# Patient Record
Sex: Male | Born: 1975
Health system: Southern US, Community
[De-identification: ages and names within clinical notes are randomized; demographics above are authoritative.]

## PROBLEM LIST (undated history)

## (undated) ENCOUNTER — Emergency Department (HOSPITAL_COMMUNITY): Discharge status: Against Medical Advice | Payer: Self-pay

## (undated) DIAGNOSIS — Z789 Other specified health status: Secondary | ICD-10-CM

## (undated) DIAGNOSIS — I2510 Atherosclerotic heart disease of native coronary artery without angina pectoris: Secondary | ICD-10-CM

## (undated) DIAGNOSIS — I10 Essential (primary) hypertension: Secondary | ICD-10-CM

## (undated) DIAGNOSIS — R7989 Other specified abnormal findings of blood chemistry: Secondary | ICD-10-CM

## (undated) DIAGNOSIS — F32A Depression, unspecified: Secondary | ICD-10-CM

## (undated) DIAGNOSIS — R7303 Prediabetes: Secondary | ICD-10-CM

## (undated) DIAGNOSIS — F329 Major depressive disorder, single episode, unspecified: Secondary | ICD-10-CM

## (undated) DIAGNOSIS — F1010 Alcohol abuse, uncomplicated: Secondary | ICD-10-CM

## (undated) DIAGNOSIS — K219 Gastro-esophageal reflux disease without esophagitis: Secondary | ICD-10-CM

## (undated) DIAGNOSIS — F419 Anxiety disorder, unspecified: Secondary | ICD-10-CM

## (undated) HISTORY — DX: Essential (primary) hypertension: I10

## (undated) HISTORY — DX: Depression, unspecified: F32.A

## (undated) HISTORY — DX: Other specified abnormal findings of blood chemistry: R79.89

## (undated) HISTORY — DX: Prediabetes: R73.03

## (undated) HISTORY — PX: NO PAST SURGERIES: SHX2092

## (undated) HISTORY — DX: Anxiety disorder, unspecified: F41.9

## (undated) HISTORY — DX: Alcohol abuse, uncomplicated: F10.10

## (undated) HISTORY — DX: Gastro-esophageal reflux disease without esophagitis: K21.9

## (undated) HISTORY — DX: Atherosclerotic heart disease of native coronary artery without angina pectoris: I25.10

## (undated) HISTORY — DX: Other specified health status: Z78.9

---
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
## 2004-05-13 ENCOUNTER — Emergency Department (HOSPITAL_COMMUNITY): Admission: EM | Admit: 2004-05-13 | Discharge: 2004-05-13 | Payer: Self-pay | Admitting: Emergency Medicine

## 2004-05-15 ENCOUNTER — Emergency Department (HOSPITAL_COMMUNITY): Admission: EM | Admit: 2004-05-15 | Discharge: 2004-05-15 | Payer: Self-pay

## 2008-06-18 ENCOUNTER — Observation Stay (HOSPITAL_COMMUNITY): Admission: EM | Admit: 2008-06-18 | Discharge: 2008-06-20 | Payer: Self-pay | Admitting: Emergency Medicine

## 2008-06-19 ENCOUNTER — Encounter (INDEPENDENT_AMBULATORY_CARE_PROVIDER_SITE_OTHER): Payer: Self-pay | Admitting: *Deleted

## 2008-06-19 ENCOUNTER — Ambulatory Visit: Payer: Self-pay | Admitting: *Deleted

## 2010-12-17 NOTE — H&P (Signed)
NAMEJEROMEY, KRUER NO.:  [id number]   MEDICAL RECORD NO.:  [id number]          PATIENT TYPE:  OBV   LOCATION:  3731                         FACILITY:  [facility]   PHYSICIAN:  Della Goo, M.D. DATE OF BIRTH:  May 01, 1976   DATE OF ADMISSION:  06/17/2008  DATE OF DISCHARGE:                              HISTORY & PHYSICAL   PRIMARY CARE PHYSICIAN:  Unassigned.   CHIEF COMPLAINT:  Chest pain and passing out episodes.   HISTORY OF PRESENT ILLNESS:  This is a 35 year old Spanish-speaking only  male who was brought to the emergency department emergently after  suffering an episode of passing out in the evening prior to admission.  The patient is unable to give the history and is drowsy at this time.  His wife gives a history who is at bedside with the assistance of the  translation telephone line for Spanish.  The patient's wife reports that  the patient has had several episodes this week of chest pain discomfort  and shortness of breath which have been episodic along with several  episodes this week of passing out and his last episode of passing out  this evening.  Per report of the wife the patient has had no fevers,  chills, congestion, nausea, vomiting or diarrhea.  The patient was  evaluated in the emergency department by the emergency department  physician and had studies including a D-dimer performed.  Chest x-ray,  drug screen and alcohol level all of which were normal except his  alcohol level was found to be 285.  When questioned about how much the  patient drinks the patient's wife reports that the patient drinks only  on weekends and drinks a 12-pack of beer.  His alcohol level was found  to be 285.   PAST MEDICAL HISTORY:  None.   MEDICATIONS:  None.   PAST SURGICAL HISTORY:  None.   ALLERGIES:  NO KNOWN DRUG ALLERGIES.   SOCIAL HISTORY:  The patient is a nonsmoker and a weekend drinker per  report and no history of illicit drug usage.   FAMILY HISTORY:  Unable to obtain at this time.   PHYSICAL EXAMINATION FINDINGS:  GENERAL:  This is a 35 year old drowsy  somnolent male in no discomfort or acute distress.  VITAL SIGNS:  Temperature 98.0, blood pressure 127/65, heart rate 115  initially, now it is 105, respirations 20-25, O2 sats 95-98%.  HEENT:  Examination normocephalic, atraumatic.  Pupils are equally round  reactive to light.  Extraocular movements are intact.  Funduscopic  benign.  Oropharynx is clear.  NECK:  Supple, full range of motion.  No thyromegaly, adenopathy,  jugulovenous distention.  CARDIOVASCULAR:  Regular rate and rhythm with mild tachycardia.  No  murmurs, gallops or rubs appreciated.  LUNGS:  Clear to auscultation bilaterally.  ABDOMEN:  Positive bowel sounds, soft, nontender, nondistended.  EXTREMITIES:  Without cyanosis, clubbing or edema.  NEUROLOGY:  The patient is able to move all four extremities and  otherwise there are no focal deficits on examination.   LABORATORY STUDIES PERFORMED:  A white blood cell count 8.6, hemoglobin  15.9, hematocrit 46.7, platelets 296, neutrophils 67%, lymphocytes 21%.  Sodium 141, potassium 3.7, chloride 105, carbon dioxide 23, BUN 14,  creatinine 0.78, glucose 108.  Urine drug screen negative, D-dimer less  than 0.22.  Point of care cardiac markers with a myoglobin of 21.5, CK-  MB 1.4, troponin less than 0.05.  Chest x-ray reveals mild peribronchial  thickening.  No opacities or effusions appreciated.  The findings are  suggestive of mild bronchitic changes.  CT scan of the head performed  reveals no acute intracranial abnormality.   ASSESSMENT:  A 35 year old male being admitted with:  1. Atypical chest pain.  2. Syncopal episode.  3. Viral upper respiratory infection.  4. Alcohol intoxication.   PLAN:  The patient will be admitted to telemetry area.  Cardiac enzymes  will be performed.  Neurologic checks will also be performed and the  patient will be  placed on IV fluids which include thiamine, folate and  multivitamin therapy for now and alcohol level will be repeated in the  a.m. and if signs of withdrawal are beginning the patient will be placed  on the IV Ativan protocol.  Orthostatic vital signs will also be  performed.  The patient will be monitored for further changes and a  further workup will ensue pending results of the patient's clinical  course and his studies.      Della Goo, M.D.  Electronically Signed     HJ/MEDQ  D:  06/18/2008  T:  06/18/2008  Job:  [id number]

## 2010-12-17 NOTE — Discharge Summary (Signed)
NAMELAQUAN, BEIER NO.:  [id number]   MEDICAL RECORD NO.:  [id number]          PATIENT TYPE:  OBV   LOCATION:  3731                         FACILITY:  [facility]   PHYSICIAN:  Beckey Rutter, MD  DATE OF BIRTH:  May 26, 1976   DATE OF ADMISSION:  06/17/2008  DATE OF DISCHARGE:  06/20/2008                               DISCHARGE SUMMARY   PRIMARY CARE PHYSICIAN:  Unassigned.   CHIEF COMPLAINT:  Passing out, the chief complaint is unclear.   BRIEF HISTORY OF PRESENT ILLNESS AND HOSPITAL COURSE:  I obtained a  detailed history in regard to the episodes via a RN interpreter.  As per  the patient and the wife, the patient came out of [place] restaurant and  started to feel dizzy.  There was some ischemic change and he then  started to feel shortness of breath, that is when they called 911.  The  same thing happened a few weeks earlier after he ate at the same  restaurant.  These episodes also happened similarly last year.  No  further detail could be obtained, although the Spanish interpreter was  very good but apparently the family which is his wife and the patient  himself are not quite sure what the symptom exactly is.  As per the  description, it seems like the patient had anaphylactic reaction to some  kind of food, especially with a fact that this has happened to him every  time he eats in this restaurant.  It is noteworthy that the patient  drank 6 beers or so the same day and this also seems to have some  association with the ethanol intake.  The patient was worked up for  reason of syncope.  His CT head is negative x2.  His 2-D echo is  essentially normal and his carotid duplex is negative.  The rest of the  workup is negative.  I reviewed the telemetry monitoring for those 48  hours with the patient in the hospital and the tele tracing did not show  any evidence of arrhythmia.  The patient did not want to stay in the  hospital for EEG, nevertheless, he  agreed to follow up with [facility]  for the EEG.  I suspect that there will be very low yield from EEG for  completion of this syncope workup and I would agree the patient's events  will be stable and substantially prolonged if we keep the patient more  days just to do the EEG.  Again, he is aware and he wanted to follow up  with [facility] for possible EEG.   The patient was counseled in regard to ethanol abuse by the social work  as well as by myself at the bedside via the [place] interpreter.   Allergic reaction/anaphylactic reaction.  It is suspected after the  detailed history.  I advised the patient to refrain from eating from the  same restaurant and to avoid eating the same food until he sees  immunologist and I had a lengthy discussion with the wife at the bedside  in regards to the referral to Immunology.  She agreed to the plan of  following  up with Immunology.  In the meantime time, I will prescribe  the patient 1 dose of epinephrine pen in case he developed anaphylactic  picture as he and the wife described.  The patient was also advised to  call 911 in case he had a similar symptoms before he passed out.   DISCHARGE DIAGNOSES:  1. Passing out episodes, felt anaphylactic reaction to certain food.  2. Alcohol binging could be the reason for him passing out.   DISCHARGE MEDICATIONS:  Adrenalin pen 0.5 mg subcutaneously  intramuscularly p.r.n. if the patient becomes unresponsive.   DISCHARGE PLAN:  The patient was discharged to follow up with  [facility] for possible EEG and to follow up with Immunology as  discussed with him.  The patient and family are aware and agreeable to  the discharge plan.  Communication was done through Spanish  interpretation therapist including today to explain the risks and the  uses of the Adrenalin pen.      Beckey Rutter, MD  Electronically Signed     EME/MEDQ  D:  06/20/2008  T:  06/20/2008  Job:  [id number]

## 2011-02-23 ENCOUNTER — Emergency Department (HOSPITAL_COMMUNITY)
Admission: EM | Admit: 2011-02-23 | Discharge: 2011-02-23 | Disposition: A | Discharge status: Home / Self Care | Payer: Self-pay | Attending: Emergency Medicine | Admitting: Emergency Medicine

## 2011-02-23 DIAGNOSIS — W460XXA Contact with hypodermic needle, initial encounter: Secondary | ICD-10-CM | POA: Insufficient documentation

## 2011-02-23 DIAGNOSIS — S61209A Unspecified open wound of unspecified finger without damage to nail, initial encounter: Secondary | ICD-10-CM | POA: Insufficient documentation

## 2011-05-07 LAB — CBC
HCT: 41.3
HCT: 42
HCT: 46.7
Hemoglobin: 14.1
Hemoglobin: 14.3
Hemoglobin: 15.9
MCHC: 34
MCHC: 34.1
MCV: 89.7
MCV: 90.7
Platelets: 241
Platelets: 253
Platelets: 296
RBC: 4.55
RBC: 4.62
RBC: 5.21
RDW: 12.9
RDW: 13
WBC: 7.5
WBC: 7.9
WBC: 8.6

## 2011-05-07 LAB — CARDIAC PANEL(CRET KIN+CKTOT+MB+TROPI)
CK, MB: 0.8
CK, MB: 1.3
Relative Index: 0.7
Relative Index: 1.1
Total CK: 117
Troponin I: 0.03

## 2011-05-07 LAB — RAPID URINE DRUG SCREEN, HOSP PERFORMED
Amphetamines: NOT DETECTED
Barbiturates: NOT DETECTED
Benzodiazepines: NOT DETECTED
Cocaine: NOT DETECTED
Opiates: NOT DETECTED
Tetrahydrocannabinol: NOT DETECTED

## 2011-05-07 LAB — DIFFERENTIAL
Basophils Absolute: 0
Basophils Relative: 0
Eosinophils Absolute: 0.3
Eosinophils Relative: 4
Lymphocytes Relative: 21
Lymphs Abs: 1.8
Monocytes Absolute: 0.7
Monocytes Relative: 8
Neutro Abs: 5.8
Neutrophils Relative %: 67

## 2011-05-07 LAB — BASIC METABOLIC PANEL
BUN: 14
CO2: 23
Calcium: 8.9
Chloride: 105
Creatinine, Ser: 0.78
GFR calc Af Amer: >60
GFR calc Af Amer: >60
GFR calc non Af Amer: >60
GFR calc non Af Amer: >60
Glucose, Bld: 108 — ABNORMAL HIGH
Potassium: 3.7
Potassium: 3.7
Sodium: 136
Sodium: 141

## 2011-05-07 LAB — POCT CARDIAC MARKERS
CKMB, poc: 1.4
CKMB, poc: <1 — ABNORMAL LOW
Myoglobin, poc: 21.5
Myoglobin, poc: 23.3
Troponin i, poc: <0.05
Troponin i, poc: <0.05

## 2011-05-07 LAB — CK TOTAL AND CKMB (NOT AT ARMC)
CK, MB: 1.6
Relative Index: 1.1
Total CK: 144

## 2011-05-07 LAB — COMPREHENSIVE METABOLIC PANEL
ALT: 36
AST: 33
Albumin: 3.6
Alkaline Phosphatase: 96
BUN: 11
CO2: 25
Calcium: 8.6
Chloride: 108
Creatinine, Ser: 0.65
GFR calc Af Amer: >60
GFR calc non Af Amer: >60
Glucose, Bld: 100 — ABNORMAL HIGH
Potassium: 4
Sodium: 139
Total Bilirubin: 0.5
Total Protein: 6.6

## 2011-05-07 LAB — D-DIMER, QUANTITATIVE: D-Dimer, Quant: <0.22

## 2011-05-07 LAB — ETHANOL
Alcohol, Ethyl (B): 285 — ABNORMAL HIGH
Alcohol, Ethyl (B): 56 — ABNORMAL HIGH

## 2011-05-07 LAB — TROPONIN I: Troponin I: 0.04

## 2011-07-09 ENCOUNTER — Emergency Department (HOSPITAL_COMMUNITY)
Admission: EM | Admit: 2011-07-09 | Discharge: 2011-07-09 | Disposition: A | Discharge status: Home / Self Care | Payer: Self-pay | Attending: Emergency Medicine | Admitting: Emergency Medicine

## 2011-07-09 ENCOUNTER — Emergency Department (HOSPITAL_COMMUNITY): Payer: Self-pay

## 2011-07-09 ENCOUNTER — Encounter: Payer: Self-pay | Admitting: *Deleted

## 2011-07-09 DIAGNOSIS — R197 Diarrhea, unspecified: Secondary | ICD-10-CM | POA: Insufficient documentation

## 2011-07-09 DIAGNOSIS — R5383 Other fatigue: Secondary | ICD-10-CM | POA: Insufficient documentation

## 2011-07-09 DIAGNOSIS — R5381 Other malaise: Secondary | ICD-10-CM | POA: Insufficient documentation

## 2011-07-09 DIAGNOSIS — R109 Unspecified abdominal pain: Secondary | ICD-10-CM | POA: Insufficient documentation

## 2011-07-09 DIAGNOSIS — R509 Fever, unspecified: Secondary | ICD-10-CM | POA: Insufficient documentation

## 2011-07-09 DIAGNOSIS — R630 Anorexia: Secondary | ICD-10-CM | POA: Insufficient documentation

## 2011-07-09 LAB — COMPREHENSIVE METABOLIC PANEL
ALT: 68 U/L — ABNORMAL HIGH (ref 0–53)
CO2: 27 mEq/L (ref 19–32)
Calcium: 9.3 mg/dL (ref 8.4–10.5)
GFR calc Af Amer: >90 mL/min (ref >90)
GFR calc non Af Amer: >90 mL/min (ref >90)
Glucose, Bld: 95 mg/dL (ref 70–99)
Sodium: 140 mEq/L (ref 135–145)

## 2011-07-09 LAB — CBC
HCT: 46.5 % (ref 39.0–52.0)
Hemoglobin: 16.6 g/dL (ref 13.0–17.0)
MCH: 30.9 pg (ref 26.0–34.0)
MCHC: 35.7 g/dL (ref 30.0–36.0)

## 2011-07-09 MED ORDER — SODIUM CHLORIDE 0.9 % IV SOLN: INTRAVENOUS | Status: DC

## 2011-07-09 MED ORDER — SODIUM CHLORIDE 0.9 % IV BOLUS (SEPSIS)
1000.0000 mL | Freq: Once | INTRAVENOUS | Status: AC
  Administered 2011-07-09: 1000 mL via INTRAVENOUS

## 2011-07-09 MED ORDER — ONDANSETRON 4 MG PO TBDP: 4.0000 mg | ORAL_TABLET | Freq: Three times a day (TID) | ORAL | Status: AC | PRN

## 2011-07-09 MED ORDER — ONDANSETRON 4 MG PO TBDP: 4.0000 mg | ORAL_TABLET | Freq: Three times a day (TID) | ORAL | Status: DC | PRN

## 2011-07-09 MED ORDER — IOHEXOL 300 MG/ML  SOLN
80.0000 mL | Freq: Once | INTRAMUSCULAR | Status: AC | PRN
  Administered 2011-07-09: 80 mL via INTRAVENOUS

## 2011-07-09 MED ORDER — TRAMADOL HCL 50 MG PO TABS: 50.0000 mg | ORAL_TABLET | Freq: Four times a day (QID) | ORAL | Status: AC | PRN

## 2011-07-09 MED ORDER — DIPHENOXYLATE-ATROPINE 2.5-0.025 MG PO TABS: 1.0000 | ORAL_TABLET | Freq: Four times a day (QID) | ORAL | Status: DC | PRN

## 2011-07-09 MED ORDER — SODIUM CHLORIDE 0.9 % IV BOLUS (SEPSIS)
250.0000 mL | Freq: Once | INTRAVENOUS | Status: AC
  Administered 2011-07-09: 1000 mL via INTRAVENOUS

## 2011-07-09 MED ORDER — TRAMADOL HCL 50 MG PO TABS: 50.0000 mg | ORAL_TABLET | Freq: Four times a day (QID) | ORAL | Status: DC | PRN

## 2011-07-09 MED ORDER — DIPHENOXYLATE-ATROPINE 2.5-0.025 MG PO TABS: 1.0000 | ORAL_TABLET | Freq: Four times a day (QID) | ORAL | Status: AC | PRN

## 2011-07-09 NOTE — ED Notes (Signed)
Reports 3 days of abd pain, diarrhea, fever. Denies vomiting.

## 2011-07-09 NOTE — ED Provider Notes (Signed)
History     CSN: [id number] Arrival date & time: 07/09/2011 10:24 AM   First MD Initiated Contact with Patient 07/09/11 1223      Chief Complaint  Patient presents with  . Abdominal Pain    (Consider location/radiation/quality/duration/timing/severity/associated sxs/prior treatment) Patient is a 35 y.o. male presenting with abdominal pain. The history is provided by the patient and the spouse.  Abdominal Pain The primary symptoms of the illness include abdominal pain, fever, fatigue and diarrhea. The primary symptoms of the illness do not include shortness of breath, nausea, vomiting, hematemesis, hematochezia or dysuria. The current episode started more than 2 days ago (3 days ago). The onset of the illness was sudden. The problem has not changed since onset. The pain came on suddenly. The abdominal pain has been unchanged since its onset. The abdominal pain is generalized. The abdominal pain does not radiate. The severity of the abdominal pain is 5/10. The abdominal pain is relieved by nothing.  Additional symptoms associated with the illness include anorexia. Symptoms associated with the illness do not include back pain.   Symptoms associated with anorexia fever and diarrhea, no nausea or vomiting or history of similar symptoms. No sick contacts.   History reviewed. No pertinent past medical history.  History reviewed. No pertinent past surgical history.  History reviewed. No pertinent family history.  History  Substance Use Topics  . Smoking status: Never Smoker   . Smokeless tobacco: Not on file  . Alcohol Use: Yes     socially      Review of Systems  Constitutional: Positive for fever and fatigue.  HENT: Negative for congestion, sore throat and neck pain.   Eyes: Negative for redness and visual disturbance.  Respiratory: Negative for cough and shortness of breath.   Cardiovascular: Negative for chest pain.  Gastrointestinal: Positive for abdominal pain, diarrhea and  anorexia. Negative for nausea, vomiting, hematochezia and hematemesis.  Genitourinary: Negative for dysuria.  Musculoskeletal: Negative for back pain.  Neurological: Negative for headaches.  Psychiatric/Behavioral: Negative for confusion.    Allergies  Review of patient's allergies indicates no known allergies.  Home Medications  No current outpatient prescriptions on file.  BP 142/96  Pulse 82  Temp(Src) 98.1 F (36.7 C) (Oral)  Resp 16  SpO2 98%  Physical Exam  Nursing note and vitals reviewed. Constitutional: He is oriented to person, place, and time. He appears well-developed and well-nourished. No distress.  HENT:  Head: Normocephalic and atraumatic.  Mouth/Throat: Oropharynx is clear and moist.  Eyes: Conjunctivae and EOM are normal. Pupils are equal, round, and reactive to light.  Neck: Normal range of motion. Neck supple.  Cardiovascular: Normal rate, regular rhythm and normal heart sounds.   Pulmonary/Chest: Effort normal and breath sounds normal.  Abdominal: Soft. Bowel sounds are normal. He exhibits no mass. There is no tenderness.  Musculoskeletal: Normal range of motion. He exhibits no edema and no tenderness.  Neurological: He is alert and oriented to person, place, and time. No cranial nerve deficit. He exhibits normal muscle tone. Coordination normal.  Skin: Skin is warm. No rash noted. He is not diaphoretic. No erythema.    ED Course  Procedures (including critical care time)  Labs Reviewed  CBC - Abnormal; Notable for the following:    WBC 10.6 (*)    All other components within normal limits  COMPREHENSIVE METABOLIC PANEL - Abnormal; Notable for the following:    Total Protein 8.5 (*)    AST 69 (*)    ALT  68 (*)    All other components within normal limits  LIPASE, BLOOD   Results for orders placed during the hospital encounter of 07/09/11  CBC      Component Value Range   WBC 10.6 (*) 4.0 - 10.5 (K/uL)   RBC 5.37  4.22 - 5.81 (MIL/uL)    Hemoglobin 16.6  13.0 - 17.0 (g/dL)   HCT 16.1  09.6 - 04.5 (%)   MCV 86.6  78.0 - 100.0 (fL)   MCH 30.9  26.0 - 34.0 (pg)   MCHC 35.7  30.0 - 36.0 (g/dL)   RDW 40.9  81.1 - 91.4 (%)   Platelets 262  150 - 400 (K/uL)  LIPASE, BLOOD      Component Value Range   Lipase 28  11 - 59 (U/L)  COMPREHENSIVE METABOLIC PANEL      Component Value Range   Sodium 140  135 - 145 (mEq/L)   Potassium 3.5  3.5 - 5.1 (mEq/L)   Chloride 100  96 - 112 (mEq/L)   CO2 27  19 - 32 (mEq/L)   Glucose, Bld 95  70 - 99 (mg/dL)   BUN 16  6 - 23 (mg/dL)   Creatinine, Ser 7.82  0.50 - 1.35 (mg/dL)   Calcium 9.3  8.4 - 95.6 (mg/dL)   Total Protein 8.5 (*) 6.0 - 8.3 (g/dL)   Albumin 4.1  3.5 - 5.2 (g/dL)   AST 69 (*) 0 - 37 (U/L)   ALT 68 (*) 0 - 53 (U/L)   Alkaline Phosphatase 115  39 - 117 (U/L)   Total Bilirubin 0.5  0.3 - 1.2 (mg/dL)   GFR calc non Af Amer >90  >90 (mL/min)   GFR calc Af Amer >90  >90 (mL/min)      Dg Abd Acute W/chest  07/09/2011  *RADIOLOGY REPORT*  Clinical Data: Abdominal pain.  ACUTE ABDOMEN SERIES (ABDOMEN 2 VIEW & CHEST 1 VIEW)  Comparison: None  Findings: The upright chest x-ray demonstrates mild bronchitic changes but no infiltrates or effusions.  Two views of the abdomen demonstrate an unremarkable bowel gas pattern.  No findings for obstruction or perforation.  Scattered radiodensity is noted in the bowel and may be ingested materials such as Pepto-Bismol. An appendiceal air shadow is noted.  The bony structures are unremarkable.  IMPRESSION:  1.  No acute cardiopulmonary findings. 2.  No plain film findings for an acute abdominal process.  Original Report Authenticated By: P. Loralie Champagne, M.D.     1. Abdominal  pain, other specified site       MDM   Patient with history of 3 days of generalized abdominal pain and diarrhea also reports fever but no vomiting no blood in stools. Lab workup an acute abdominal series negative except for borderline elevation of the white blood  cell count. History does not fit consistent with a viral gastroenteritis will therefore get CT of the abdomen to rule out an inflammatory bowel process. Patient currently is nontoxic afebrile and no significant distress.  Will move patient to the CDU in a holding status, mid-level will continue care there and followup with CT results. Will give a bolus of 1 L normal saline as well.    Medical screening examination/treatment/procedure(s) were conducted as a shared visit with non-physician practitioner(s) and myself.  I personally evaluated the patient during the encounter      Shelda Jakes, MD 07/09/11 [phone]

## 2011-07-09 NOTE — ED Provider Notes (Signed)
Pt is here for abdominal pain and moved to the CDU to await CT scan to r/o inflammatory bowel process and to have pain control.   Abd/Pel CT scan show no acute or inflammatory process. Pts labs and vital signs are stable as well. Will D/C pt with pain control meds and antinausea medication.  Dorthula Matas, PA 07/09/11 [id number]

## 2011-07-10 NOTE — ED Provider Notes (Signed)
Medical screening examination/treatment/procedure(s) were conducted as a shared visit with non-physician practitioner(s) and myself.  I personally evaluated the patient during the encounter  Shelda Jakes, MD 07/10/11 [phone]

## 2014-01-16 ENCOUNTER — Emergency Department (HOSPITAL_COMMUNITY)
Admission: EM | Admit: 2014-01-16 | Discharge: 2014-01-16 | Disposition: A | Discharge status: Home / Self Care | Payer: Self-pay | Attending: Emergency Medicine | Admitting: Emergency Medicine

## 2014-01-16 ENCOUNTER — Encounter (HOSPITAL_COMMUNITY): Payer: Self-pay | Admitting: Emergency Medicine

## 2014-01-16 DIAGNOSIS — R109 Unspecified abdominal pain: Secondary | ICD-10-CM | POA: Insufficient documentation

## 2014-01-16 DIAGNOSIS — R197 Diarrhea, unspecified: Secondary | ICD-10-CM | POA: Insufficient documentation

## 2014-01-16 DIAGNOSIS — M545 Low back pain, unspecified: Secondary | ICD-10-CM | POA: Insufficient documentation

## 2014-01-16 DIAGNOSIS — B353 Tinea pedis: Secondary | ICD-10-CM | POA: Insufficient documentation

## 2014-01-16 LAB — URINALYSIS, ROUTINE W REFLEX MICROSCOPIC
Bilirubin Urine: NEGATIVE
Glucose, UA: NEGATIVE mg/dL
HGB URINE DIPSTICK: NEGATIVE
Ketones, ur: NEGATIVE mg/dL
Leukocytes, UA: NEGATIVE
NITRITE: NEGATIVE
PROTEIN: NEGATIVE mg/dL
SPECIFIC GRAVITY, URINE: 1.023 (ref 1.005–1.030)
UROBILINOGEN UA: 0.2 mg/dL (ref 0.0–1.0)
pH: 6 (ref 5.0–8.0)

## 2014-01-16 LAB — COMPREHENSIVE METABOLIC PANEL
ALT: 67 U/L — AB (ref 0–53)
AST: 46 U/L — ABNORMAL HIGH (ref 0–37)
Albumin: 4.7 g/dL (ref 3.5–5.2)
Alkaline Phosphatase: 115 U/L (ref 39–117)
BUN: 11 mg/dL (ref 6–23)
CALCIUM: 9.8 mg/dL (ref 8.4–10.5)
CO2: 25 meq/L (ref 19–32)
CREATININE: 0.58 mg/dL (ref 0.50–1.35)
Chloride: 98 mEq/L (ref 96–112)
GLUCOSE: 137 mg/dL — AB (ref 70–99)
Potassium: 3.4 mEq/L — ABNORMAL LOW (ref 3.7–5.3)
Sodium: 138 mEq/L (ref 137–147)
TOTAL PROTEIN: 8.7 g/dL — AB (ref 6.0–8.3)
Total Bilirubin: 0.8 mg/dL (ref 0.3–1.2)

## 2014-01-16 LAB — CBC WITH DIFFERENTIAL/PLATELET
Basophils Absolute: 0 10*3/uL (ref 0.0–0.1)
Basophils Relative: 0 % (ref 0–1)
EOS ABS: 0 10*3/uL (ref 0.0–0.7)
EOS PCT: 0 % (ref 0–5)
HEMATOCRIT: 45.7 % (ref 39.0–52.0)
Hemoglobin: 16.4 g/dL (ref 13.0–17.0)
LYMPHS ABS: 0.5 10*3/uL — AB (ref 0.7–4.0)
LYMPHS PCT: 5 % — AB (ref 12–46)
MCH: 31.7 pg (ref 26.0–34.0)
MCHC: 35.9 g/dL (ref 30.0–36.0)
MCV: 88.4 fL (ref 78.0–100.0)
MONO ABS: 0.7 10*3/uL (ref 0.1–1.0)
Monocytes Relative: 7 % (ref 3–12)
Neutro Abs: 9 10*3/uL — ABNORMAL HIGH (ref 1.7–7.7)
Neutrophils Relative %: 88 % — ABNORMAL HIGH (ref 43–77)
Platelets: 214 10*3/uL (ref 150–400)
RBC: 5.17 MIL/uL (ref 4.22–5.81)
RDW: 12.4 % (ref 11.5–15.5)
WBC: 10.2 10*3/uL (ref 4.0–10.5)

## 2014-01-16 LAB — LIPASE, BLOOD: LIPASE: 31 U/L (ref 11–59)

## 2014-01-16 MED ORDER — ACETAMINOPHEN 325 MG PO TABS
650.0000 mg | ORAL_TABLET | Freq: Once | ORAL | Status: AC
  Administered 2014-01-16: 650 mg via ORAL
  Filled 2014-01-16: qty 2

## 2014-01-16 MED ORDER — SODIUM CHLORIDE 0.9 % IV BOLUS (SEPSIS): 1000.0000 mL | Freq: Once | INTRAVENOUS | Status: DC

## 2014-01-16 NOTE — ED Notes (Signed)
Pt states that he has generalized abdominal pain and diarrhea. Denies N/V. Bowel sounds present. NAD noted.

## 2014-01-16 NOTE — Discharge Instructions (Signed)
Drink 1 or 2 L of water each day. Take Tylenol for pain For the foot fungus, use Tinactin, or Lotrimin.     Diarrhea Diarrhea is watery poop (stool). It can make you feel weak, tired, thirsty, or give you a dry mouth (signs of dehydration). Watery poop is a sign of another problem, most often an infection. It often lasts 2 3 days. It can last longer if it is a sign of something serious. Take care of yourself as told by your doctor. HOME CARE   Drink 1 cup (8 ounces) of fluid each time you have watery poop.  Do not drink the following fluids:  Those that contain simple sugars (fructose, glucose, galactose, lactose, sucrose, maltose).  Sports drinks.  Fruit juices.  Whole milk products.  Sodas.  Drinks with caffeine (coffee, tea, soda) or alcohol.  Oral rehydration solution may be used if the doctor says it is okay. You may make your own solution. Follow this recipe:    teaspoon table salt.   teaspoon baking soda.   teaspoon salt substitute containing potassium chloride.  1 tablespoons sugar.  1 liter (34 ounces) of water.  Avoid the following foods:  High fiber foods, such as raw fruits and vegetables.  Nuts, seeds, and whole grain breads and cereals.   Those that are sweetened with sugar alcohols (xylitol, sorbitol, mannitol).  Try eating the following foods:  Starchy foods, such as rice, toast, pasta, low-sugar cereal, oatmeal, baked potatoes, crackers, and bagels.  Bananas.  Applesauce.  Eat probiotic-rich foods, such as yogurt and milk products that are fermented.  Wash your hands well after each time you have watery poop.  Only take medicine as told by your doctor.  Take a warm bath to help lessen burning or pain from having watery poop. GET HELP RIGHT AWAY IF:   You cannot drink fluids without throwing up (vomiting).  You keep throwing up.  You have blood in your poop, or your poop looks black and tarry.  You do not pee (urinate) in 6 8  hours, or there is only a small amount of very dark pee.  You have belly (abdominal) pain that gets worse or stays in the same spot (localizes).  You are weak, dizzy, confused, or lightheaded.  You have a very bad headache.  Your watery poop gets worse or does not get better.  You have a fever or lasting symptoms for more than 2 3 days.  You have a fever and your symptoms suddenly get worse. MAKE SURE YOU:   Understand these instructions.  Will watch your condition.  Will get help right away if you are not doing well or get worse. Document Released: 01/07/2008 Document Revised: 04/14/2012 Document Reviewed: 03/28/2012 [facility] Patient Information 2014 [place], [place].  Diet for Diarrhea, Adult Frequent, runny stools (diarrhea) may be caused or worsened by food or drink. Diarrhea may be relieved by changing your diet. Since diarrhea can last up to 7 days, it is easy for you to lose too much fluid from the body and become dehydrated. Fluids that are lost need to be replaced. Along with a modified diet, make sure you drink enough fluids to keep your urine clear or pale yellow. DIET INSTRUCTIONS  Ensure adequate fluid intake (hydration): have 1 cup (8 oz) of fluid for each diarrhea episode. Avoid fluids that contain simple sugars or sports drinks, fruit juices, whole milk products, and sodas. Your urine should be clear or pale yellow if you are drinking enough fluids.  Hydrate with an oral rehydration solution that you can purchase at pharmacies, retail stores, and online. You can prepare an oral rehydration solution at home by mixing the following ingredients together:    tsp table salt.   tsp baking soda.   tsp salt substitute containing potassium chloride.  1  tablespoons sugar.  1 L (34 oz) of water.  Certain foods and beverages may increase the speed at which food moves through the gastrointestinal (GI) tract. These foods and beverages should be avoided and  include:  Caffeinated and alcoholic beverages.  High-fiber foods, such as raw fruits and vegetables, nuts, seeds, and whole grain breads and cereals.  Foods and beverages sweetened with sugar alcohols, such as xylitol, sorbitol, and mannitol.  Some foods may be well tolerated and may help thicken stool including:  Starchy foods, such as rice, toast, pasta, low-sugar cereal, oatmeal, grits, baked potatoes, crackers, and bagels.   Bananas.   Applesauce.  Add probiotic-rich foods to help increase healthy bacteria in the GI tract, such as yogurt and fermented milk products. RECOMMENDED FOODS AND BEVERAGES Starches Choose foods with less than 2 g of fiber per serving.  Recommended:  White, [place], and pita breads, plain rolls, buns, bagels. Plain muffins, matzo. Soda, saltine, or graham crackers. Pretzels, melba toast, zwieback. Cooked cereals made with water: cornmeal, farina, cream cereals. Dry cereals: refined corn, wheat, rice. Potatoes prepared any way without skins, refined macaroni, spaghetti, noodles, refined rice.  Avoid:  Bread, rolls, or crackers made with whole wheat, multi-grains, rye, bran seeds, nuts, or coconut. Corn tortillas or taco shells. Cereals containing whole grains, multi-grains, bran, coconut, nuts, raisins. Cooked or dry oatmeal. Coarse wheat cereals, granola. Cereals advertised as "high-fiber." Potato skins. Whole grain pasta, wild or brown rice. Popcorn. Sweet potatoes, yams. Sweet rolls, doughnuts, waffles, pancakes, sweet breads. Vegetables  Recommended: Strained tomato and vegetable juices. Most well-cooked and canned vegetables without seeds. Fresh: Tender lettuce, cucumber without the skin, cabbage, spinach, bean sprouts.  Avoid: Fresh, cooked, or canned: Artichokes, baked beans, beet greens, broccoli, Brussels sprouts, corn, kale, legumes, peas, sweet potatoes. Cooked: Green or red cabbage, spinach. Avoid large servings of any vegetables because vegetables  shrink when cooked, and they contain more fiber per serving than fresh vegetables. Fruit  Recommended: Cooked or canned: Apricots, applesauce, cantaloupe, cherries, fruit cocktail, grapefruit, grapes, kiwi, mandarin oranges, peaches, pears, plums, watermelon. Fresh: Apples without skin, ripe banana, grapes, cantaloupe, cherries, grapefruit, peaches, oranges, plums. Keep servings limited to  cup or 1 piece.  Avoid: Fresh: Apples with skin, apricots, mangoes, pears, raspberries, strawberries. Prune juice, stewed or dried prunes. Dried fruits, raisins, dates. Large servings of all fresh fruits. Protein  Recommended: Ground or well-cooked tender beef, ham, veal, lamb, pork, or poultry. Eggs. Fish, oysters, shrimp, lobster, other seafoods. Liver, organ meats.  Avoid: Tough, fibrous meats with gristle. Peanut butter, smooth or chunky. Cheese, nuts, seeds, legumes, dried peas, beans, lentils. Dairy  Recommended: Yogurt, lactose-free milk, kefir, drinkable yogurt, buttermilk, soy milk, or plain hard cheese.  Avoid: Milk, chocolate milk, beverages made with milk, such as milkshakes. Soups  Recommended: Bouillon, broth, or soups made from allowed foods. Any strained soup.  Avoid: Soups made from vegetables that are not allowed, cream or milk-based soups. Desserts and Sweets  Recommended: Sugar-free gelatin, sugar-free frozen ice pops made without sugar alcohol.  Avoid: Plain cakes and cookies, pie made with fruit, pudding, custard, cream pie. Gelatin, fruit, ice, sherbet, frozen ice pops. Ice cream, ice milk without nuts. Plain  hard candy, honey, jelly, molasses, syrup, sugar, chocolate syrup, gumdrops, marshmallows. Fats and Oils  Recommended: Limit fats to less than 8 tsp per day.  Avoid: Seeds, nuts, olives, avocados. Margarine, butter, cream, mayonnaise, salad oils, plain salad dressings. Plain gravy, crisp bacon without rind. Beverages  Recommended: Water, decaffeinated teas, oral  rehydration solutions, sugar-free beverages not sweetened with sugar alcohols.  Avoid: Fruit juices, caffeinated beverages (coffee, tea, soda), alcohol, sports drinks, or lemon-lime soda. Condiments  Recommended: Ketchup, mustard, horseradish, vinegar, cocoa powder. Spices in moderation: allspice, basil, bay leaves, celery powder or leaves, cinnamon, cumin powder, curry powder, ginger, mace, marjoram, onion or garlic powder, oregano, paprika, parsley flakes, ground pepper, rosemary, sage, savory, tarragon, thyme, turmeric.  Avoid: Coconut, honey. Document Released: 10/11/2003 Document Revised: 04/14/2012 Document Reviewed: 12/05/2011 [facility] Patient Information 2014 [place], [place].

## 2014-01-16 NOTE — ED Provider Notes (Signed)
CSN: [id number]     Arrival date & time 01/16/14  1310 History   First MD Initiated Contact with Patient 01/16/14 1714     Chief Complaint  Patient presents with  . Abdominal Pain  . Diarrhea     (Consider location/radiation/quality/duration/timing/severity/associated sxs/prior Treatment) Patient is a 38 y.o. male presenting with abdominal pain and diarrhea. The history is provided by the patient.  Abdominal Pain Associated symptoms: diarrhea   Diarrhea Associated symptoms: abdominal pain    He has had diarrhea for 3 days, yellow in color, and watery. No known sick contacts. No fever, chills, nausea or vomiting. He has crampy abdominal pain. He also has mild, low back pain. He has a rash on his feet. No fever, chills, cough, shortness of breath, or chest pain. There are no known modifying factors.  History reviewed. No pertinent past medical history. History reviewed. No pertinent past surgical history. No family history on file. History  Substance Use Topics  . Smoking status: Never Smoker   . Smokeless tobacco: Not on file  . Alcohol Use: Yes     Comment: socially    Review of Systems  Gastrointestinal: Positive for abdominal pain and diarrhea.  All other systems reviewed and are negative.     Allergies  Review of patient's allergies indicates no known allergies.  Home Medications   Prior to Admission medications   Not on File   BP 130/83  Pulse 84  Temp(Src) 98.3 F (36.8 C) (Oral)  Resp 12  SpO2 94% Physical Exam  Nursing note and vitals reviewed. Constitutional: He is oriented to person, place, and time. He appears well-developed and well-nourished.  HENT:  Head: Normocephalic and atraumatic.  Right Ear: External ear normal.  Left Ear: External ear normal.  Eyes: Conjunctivae and EOM are normal. Pupils are equal, round, and reactive to light.  Neck: Normal range of motion and phonation normal. Neck supple.  Cardiovascular: Normal rate, regular rhythm,  normal heart sounds and intact distal pulses.   Pulmonary/Chest: Effort normal and breath sounds normal. He exhibits no bony tenderness.  Abdominal: Soft. There is no tenderness.  Musculoskeletal: Normal range of motion.  Neurological: He is alert and oriented to person, place, and time. No cranial nerve deficit or sensory deficit. He exhibits normal muscle tone. Coordination normal.  Skin: Skin is warm, dry and intact.  Psychiatric: He has a normal mood and affect. His behavior is normal. Judgment and thought content normal.    ED Course  Procedures (including critical care time) Medications  acetaminophen (TYLENOL) tablet 650 mg (not administered)    Patient Vitals for the past 24 hrs:  BP Temp Temp src Pulse Resp SpO2  01/16/14 1829 130/83 mmHg - - 84 12 94 %  01/16/14 1715 136/77 mmHg - - 91 19 99 %  01/16/14 1700 142/98 mmHg - - 106 18 98 %  01/16/14 1532 138/85 mmHg - - 87 18 99 %  01/16/14 1315 128/86 mmHg 98.3 F (36.8 C) Oral 101 18 96 %       Labs Review Labs Reviewed  CBC WITH DIFFERENTIAL - Abnormal; Notable for the following:    Neutrophils Relative % 88 (*)    Neutro Abs 9.0 (*)    Lymphocytes Relative 5 (*)    Lymphs Abs 0.5 (*)    All other components within normal limits  COMPREHENSIVE METABOLIC PANEL - Abnormal; Notable for the following:    Potassium 3.4 (*)    Glucose, Bld 137 (*)  Total Protein 8.7 (*)    AST 46 (*)    ALT 67 (*)    All other components within normal limits  URINALYSIS, ROUTINE W REFLEX MICROSCOPIC - Abnormal; Notable for the following:    Color, Urine AMBER (*)    All other components within normal limits  LIPASE, BLOOD    Imaging Review No results found.   EKG Interpretation None      MDM   Final diagnoses:  Diarrhea  Athlete's foot    Nonspecific diarrhea. Doubt SBI or metabolic instability.  Nursing Notes Reviewed/ Care Coordinated Applicable Imaging Reviewed Interpretation of Laboratory Data incorporated  into ED treatment  The patient appears reasonably screened and/or stabilized for discharge and I doubt any other medical condition or other [facility] requiring further screening, evaluation, or treatment in the ED at this time prior to discharge.  Plan: Home Medications- Immodium, Tinactin; Home Treatments- rest, fluids; return here if the recommended treatment, does not improve the symptoms; Recommended follow up- PCP prn    Flint MelterElliott L Teffany Blaszczyk, MD 01/17/14 1352

## 2014-01-20 ENCOUNTER — Ambulatory Visit: Payer: Self-pay | Attending: Internal Medicine

## 2015-01-23 ENCOUNTER — Ambulatory Visit: Payer: Self-pay | Admitting: Internal Medicine

## 2015-02-13 ENCOUNTER — Ambulatory Visit: Payer: Self-pay

## 2015-02-13 ENCOUNTER — Other Ambulatory Visit: Payer: Self-pay | Admitting: Occupational Medicine

## 2015-02-13 DIAGNOSIS — T148XXA Other injury of unspecified body region, initial encounter: Secondary | ICD-10-CM

## 2015-04-13 ENCOUNTER — Ambulatory Visit: Payer: Self-pay | Attending: Internal Medicine

## 2016-08-11 ENCOUNTER — Encounter (HOSPITAL_COMMUNITY): Payer: Self-pay

## 2016-08-11 ENCOUNTER — Emergency Department (HOSPITAL_COMMUNITY): Payer: Self-pay

## 2016-08-11 ENCOUNTER — Emergency Department (HOSPITAL_COMMUNITY)
Admission: EM | Admit: 2016-08-11 | Discharge: 2016-08-11 | Disposition: A | Discharge status: Home / Self Care | Payer: Self-pay | Attending: Emergency Medicine | Admitting: Emergency Medicine

## 2016-08-11 DIAGNOSIS — R071 Chest pain on breathing: Secondary | ICD-10-CM | POA: Insufficient documentation

## 2016-08-11 LAB — BASIC METABOLIC PANEL
Anion gap: 11 (ref 5–15)
BUN: 11 mg/dL (ref 6–20)
CALCIUM: 9.5 mg/dL (ref 8.9–10.3)
CHLORIDE: 100 mmol/L — AB (ref 101–111)
CO2: 25 mmol/L (ref 22–32)
CREATININE: 0.69 mg/dL (ref 0.61–1.24)
Glucose, Bld: 181 mg/dL — ABNORMAL HIGH (ref 65–99)
Potassium: 3.7 mmol/L (ref 3.5–5.1)
SODIUM: 136 mmol/L (ref 135–145)

## 2016-08-11 LAB — I-STAT TROPONIN, ED: TROPONIN I, POC: 0.01 ng/mL (ref 0.00–0.08)

## 2016-08-11 LAB — TROPONIN I: Troponin I: <0.03 ng/mL (ref <0.03)

## 2016-08-11 MED ORDER — MELOXICAM 7.5 MG PO TABS
7.5000 mg | ORAL_TABLET | Freq: Once | ORAL | Status: AC
  Administered 2016-08-11: 7.5 mg via ORAL
  Filled 2016-08-11: qty 1

## 2016-08-11 MED ORDER — OXYCODONE-ACETAMINOPHEN 5-325 MG PO TABS
ORAL_TABLET | ORAL | Status: AC
  Filled 2016-08-11: qty 1

## 2016-08-11 MED ORDER — OXYCODONE-ACETAMINOPHEN 5-325 MG PO TABS
1.0000 | ORAL_TABLET | ORAL | Status: DC | PRN
  Administered 2016-08-11: 1 via ORAL

## 2016-08-11 MED ORDER — MELOXICAM 7.5 MG PO TABS: 7.5000 mg | ORAL_TABLET | Freq: Every day | ORAL | 0 refills | Status: AC
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
## 2016-08-11 NOTE — ED Provider Notes (Signed)
[facility]-EMERGENCY DEPT Provider Note   CSN: [id number] Arrival date & time: 08/11/16  4098     History   Chief Complaint Chief Complaint  Patient presents with  . Chest Pain    HPI Stephen Oneal is a 41 y.o. male.   Chest Pain   This is a new problem. The current episode started more than 1 week ago. The problem occurs daily. The problem has not changed since onset.The pain is associated with coughing, raising an arm, breathing and lifting. The pain is present in the substernal region and lateral region. The pain is moderate. The quality of the pain is described as sharp and stabbing. The pain does not radiate. The symptoms are aggravated by deep breathing and certain positions.    History reviewed. No pertinent past medical history.  There are no active problems to display for this patient.   History reviewed. No pertinent surgical history.     Home Medications    Prior to Admission medications   Medication Sig Start Date End Date Taking? Authorizing Provider  ibuprofen (ADVIL,MOTRIN) 200 MG tablet Take 200 mg by mouth every 6 (six) hours as needed (for pain).   Yes Historical Provider, MD  meloxicam (MOBIC) 7.5 MG tablet Take 1 tablet (7.5 mg total) by mouth daily. 08/11/16 08/25/16  Marily Memos, MD    Family History No family history on file.  Social History Social History  Substance Use Topics  . Smoking status: Never Smoker  . Smokeless tobacco: Never Used  . Alcohol use Yes     Comment: socially     Allergies   Patient has no known allergies.   Review of Systems Review of Systems  Cardiovascular: Positive for chest pain.  All other systems reviewed and are negative.    Physical Exam Updated Vital Signs BP 148/85 (BP Location: Right Arm)   Pulse 68   Temp 97 F (36.1 C) (Oral)   Resp 18   Wt 140 lb (63.5 kg)   SpO2 98%   Physical Exam  Constitutional: He appears well-developed and well-nourished.  HENT:  Head: Normocephalic and  atraumatic.  Eyes: Conjunctivae and EOM are normal.  Neck: Normal range of motion.  Cardiovascular: Normal rate and normal heart sounds.  Exam reveals no gallop and no friction rub.   No murmur heard. Pulmonary/Chest: Effort normal. No respiratory distress. He has no wheezes. He exhibits tenderness (left lateral ribs).  Abdominal: Soft. He exhibits no distension.  Musculoskeletal: Normal range of motion.  Neurological: He is alert.  Nursing note and vitals reviewed.    ED Treatments / Results  Labs (all labs ordered are listed, but only abnormal results are displayed) Labs Reviewed  BASIC METABOLIC PANEL - Abnormal; Notable for the following:       Result Value   Chloride 100 (*)    Glucose, Bld 181 (*)    All other components within normal limits  TROPONIN I  I-STAT TROPOININ, ED    EKG  EKG Interpretation  Date/Time:  Monday August 11 2016 10:00:06 EST Ventricular Rate:  99 PR Interval:  172 QRS Duration: 78 QT Interval:  342 QTC Calculation: 438 R Axis:   93 Text Interpretation:  Normal sinus rhythm Rightward axis Borderline ECG increased amplitude of all waves compared to 2009 Confirmed by [facility] MD, Kedrick Mcnamee [phone]) on 08/11/2016 4:33:52 PM       Radiology Dg Chest 2 View  Result Date: 08/11/2016 CLINICAL DATA:  Left sided chest pain and SOB X 8 days.  EXAM: CHEST - 2 VIEW COMPARISON:  06/17/2008 FINDINGS: Lungs are clear. Heart size and mediastinal contours are within normal limits. No effusion.  No pneumothorax. Visualized bones unremarkable. IMPRESSION: No acute cardiopulmonary disease. Electronically Signed   By: Corlis Leak  Hassell M.D.   On: 08/11/2016 10:53    Procedures Procedures (including critical care time)  Medications Ordered in ED Medications  meloxicam (MOBIC) tablet 7.5 mg (7.5 mg Oral Given 08/11/16 1949)     Initial Impression / Assessment and Plan / ED Course  I have reviewed the triage vital signs and the nursing notes.  Pertinent labs & imaging  results that were available during my care of the patient were reviewed by me and considered in my medical decision making (see chart for details).  Clinical Course     PERC negativde, doubt PE.  Atypical for ACS, negative troponins and no ischemic changes on ECG.  No e/o PTX/PNA on CXR.  Possibly rib/muscular pain. Plan for NSAIDs and pcp follow up.   Final Clinical Impressions(s) / ED Diagnoses   Final diagnoses:  Chest pain on breathing    New Prescriptions Discharge Medication List as of 08/11/2016  7:14 PM    START taking these medications   Details  meloxicam (MOBIC) 7.5 MG tablet Take 1 tablet (7.5 mg total) by mouth daily., Starting Mon 08/11/2016, Until Mon 08/25/2016, Print         Marily MemosJason Bubba Vanbenschoten, MD 08/12/16 [phone]

## 2016-08-11 NOTE — ED Notes (Signed)
Delay explained 

## 2016-08-11 NOTE — ED Notes (Signed)
E-signature not available. Pt verbalized understanding of DC instructions, prescription and work note.

## 2016-11-16 ENCOUNTER — Encounter (HOSPITAL_COMMUNITY): Payer: Self-pay | Admitting: Emergency Medicine

## 2016-11-16 ENCOUNTER — Emergency Department (HOSPITAL_COMMUNITY): Payer: Self-pay

## 2016-11-16 ENCOUNTER — Emergency Department (HOSPITAL_COMMUNITY)
Admission: EM | Admit: 2016-11-16 | Discharge: 2016-11-17 | Disposition: A | Discharge status: Home / Self Care | Payer: Self-pay | Attending: Emergency Medicine | Admitting: Emergency Medicine

## 2016-11-16 DIAGNOSIS — Y92810 Car as the place of occurrence of the external cause: Secondary | ICD-10-CM | POA: Insufficient documentation

## 2016-11-16 DIAGNOSIS — R0989 Other specified symptoms and signs involving the circulatory and respiratory systems: Secondary | ICD-10-CM | POA: Insufficient documentation

## 2016-11-16 DIAGNOSIS — Y999 Unspecified external cause status: Secondary | ICD-10-CM | POA: Insufficient documentation

## 2016-11-16 DIAGNOSIS — X371XXA Tornado, initial encounter: Secondary | ICD-10-CM | POA: Insufficient documentation

## 2016-11-16 DIAGNOSIS — Y9389 Activity, other specified: Secondary | ICD-10-CM | POA: Insufficient documentation

## 2016-11-16 DIAGNOSIS — S8011XA Contusion of right lower leg, initial encounter: Secondary | ICD-10-CM | POA: Insufficient documentation
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
## 2016-11-16 NOTE — ED Notes (Signed)
Patient transported to X-ray 
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
## 2016-11-16 NOTE — ED Notes (Signed)
Assessed pt w/ the use of the interpertor
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
## 2016-11-17 NOTE — Procedures (Signed)
Preop diagnosis: Pharyngeal foreign body Postop diagnosis: normal pharynx and larynx Procedure: Transnasal fiberoptic laryngoscopy Surgeon: Jenne Pane Anesth: Topical with 4% lidocaine Compl: None Findings: No foreign body or bleeding was seen in the pharynx or larynx. Description:  After discussing risks, benefits, and alternatives, the patient was placed in a seated position and the right nasal passage was sprayed with topical anesthetic.  The fiberoptic scope was passed through the right nasal passage to view the pharynx and larynx.  Findings are noted above.  The scope was then removed and he was returned to nursing care in stable condition.
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
## 2018-02-01 ENCOUNTER — Emergency Department (HOSPITAL_COMMUNITY): Payer: Self-pay

## 2018-02-01 ENCOUNTER — Other Ambulatory Visit: Payer: Self-pay

## 2018-02-01 ENCOUNTER — Emergency Department (HOSPITAL_COMMUNITY)
Admission: EM | Admit: 2018-02-01 | Discharge: 2018-02-01 | Disposition: A | Discharge status: Home / Self Care | Payer: Self-pay | Attending: Emergency Medicine | Admitting: Emergency Medicine

## 2018-02-01 ENCOUNTER — Encounter (HOSPITAL_COMMUNITY): Payer: Self-pay | Admitting: Emergency Medicine

## 2018-02-01 DIAGNOSIS — K219 Gastro-esophageal reflux disease without esophagitis: Secondary | ICD-10-CM | POA: Insufficient documentation

## 2018-02-01 DIAGNOSIS — F419 Anxiety disorder, unspecified: Secondary | ICD-10-CM | POA: Insufficient documentation

## 2018-02-01 LAB — CBC
HEMATOCRIT: 44.1 % (ref 39.0–52.0)
Hemoglobin: 15.1 g/dL (ref 13.0–17.0)
MCH: 31.3 pg (ref 26.0–34.0)
MCHC: 34.2 g/dL (ref 30.0–36.0)
MCV: 91.3 fL (ref 78.0–100.0)
Platelets: 223 10*3/uL (ref 150–400)
RBC: 4.83 MIL/uL (ref 4.22–5.81)
RDW: 12.1 % (ref 11.5–15.5)
WBC: 8.8 10*3/uL (ref 4.0–10.5)

## 2018-02-01 LAB — URINALYSIS, ROUTINE W REFLEX MICROSCOPIC
Bacteria, UA: NONE SEEN
Bilirubin Urine: NEGATIVE
GLUCOSE, UA: NEGATIVE mg/dL
Ketones, ur: NEGATIVE mg/dL
Leukocytes, UA: NEGATIVE
Nitrite: NEGATIVE
PROTEIN: NEGATIVE mg/dL
Specific Gravity, Urine: 1.017 (ref 1.005–1.030)
pH: 5 (ref 5.0–8.0)

## 2018-02-01 LAB — HEPATIC FUNCTION PANEL
ALBUMIN: 4.5 g/dL (ref 3.5–5.0)
ALT: 38 U/L (ref 0–44)
AST: 45 U/L — ABNORMAL HIGH (ref 15–41)
Alkaline Phosphatase: 75 U/L (ref 38–126)
BILIRUBIN TOTAL: 0.6 mg/dL (ref 0.3–1.2)
Bilirubin, Direct: <0.1 mg/dL (ref 0.0–0.2)
Total Protein: 7.9 g/dL (ref 6.5–8.1)

## 2018-02-01 LAB — I-STAT TROPONIN, ED: TROPONIN I, POC: 0.01 ng/mL (ref 0.00–0.08)

## 2018-02-01 LAB — BASIC METABOLIC PANEL
ANION GAP: 10 (ref 5–15)
BUN: 13 mg/dL (ref 6–20)
CALCIUM: 9.3 mg/dL (ref 8.9–10.3)
CHLORIDE: 103 mmol/L (ref 98–111)
CO2: 28 mmol/L (ref 22–32)
Creatinine, Ser: 0.71 mg/dL (ref 0.61–1.24)
GFR calc non Af Amer: >60 mL/min (ref >60)
Glucose, Bld: 149 mg/dL — ABNORMAL HIGH (ref 70–99)
POTASSIUM: 3.6 mmol/L (ref 3.5–5.1)
Sodium: 141 mmol/L (ref 135–145)

## 2018-02-01 LAB — CBG MONITORING, ED: Glucose-Capillary: 111 mg/dL — ABNORMAL HIGH (ref 70–99)

## 2018-02-01 LAB — LIPASE, BLOOD: LIPASE: 40 U/L (ref 11–51)

## 2018-02-01 MED ORDER — RANITIDINE HCL 150 MG PO CAPS: 150.0000 mg | ORAL_CAPSULE | Freq: Every day | ORAL | 0 refills | Status: DC

## 2018-02-01 MED ORDER — GI COCKTAIL ~~LOC~~
30.0000 mL | Freq: Once | ORAL | Status: AC
  Administered 2018-02-01: 30 mL via ORAL
  Filled 2018-02-01: qty 30

## 2018-02-01 MED ORDER — IOPAMIDOL (ISOVUE-370) INJECTION 76%
100.0000 mL | Freq: Once | INTRAVENOUS | Status: AC | PRN
  Administered 2018-02-01: 100 mL via INTRAVENOUS

## 2018-02-01 MED ORDER — IOPAMIDOL (ISOVUE-370) INJECTION 76%
INTRAVENOUS | Status: AC
  Filled 2018-02-01: qty 100

## 2018-02-01 NOTE — ED Notes (Signed)
Patient transported to X-ray 

## 2018-02-01 NOTE — ED Notes (Signed)
Patient transported to CT 

## 2018-02-01 NOTE — ED Provider Notes (Signed)
[facility] Provider Note   CSN: [id number] Arrival date & time: 02/01/18  1003     History   Chief Complaint Chief Complaint  Patient presents with  . Shortness of Breath  . Dizziness    HPI Stephen Oneal is a 42 y.o. male presenting for evaluation of shortness of breath and vomiting.  Patient states over the past week, he has been having intermittent shortness of breath.  He had one episode of vomiting this morning.  He states he will feel like he is suddenly having difficulty breathing, and then go outside and walk around to catch some air.  This resolves his symptoms.  Symptoms often occur after eating, worse with spicy food.  He states he has been told that he has gastritis in the past, took some medicine first short period of time, but is not currently on anything.  He denies associated chest pain.  He states he has had a poor appetite for the past several weeks, he feels hungry but does not want to eat.  Patient denies fevers, chills, abdominal pain, urinary symptoms, abnormal bowel movements. Pt states right now all he feels "nerves." no SOB, nausea, or other sxs.  HPI  History reviewed. No pertinent past medical history.  There are no active problems to display for this patient.   History reviewed. No pertinent surgical history.      Home Medications    Prior to Admission medications   Medication Sig Start Date End Date Taking? Authorizing Provider  ibuprofen (ADVIL,MOTRIN) 200 MG tablet Take 200 mg by mouth every 6 (six) hours as needed (for pain).    [provider]  ranitidine (ZANTAC) 150 MG capsule Take 1 capsule (150 mg total) by mouth daily. 02/01/18   Kenora Spayd, PA-C    Family History No family history on file.  Social History Social History   Tobacco Use  . Smoking status: Never Smoker  . Smokeless tobacco: Never Used  Substance Use Topics  . Alcohol use: Yes    Comment: socially  .  Drug use: No     Allergies   Patient has no known allergies.   Review of Systems Review of Systems  Respiratory: Positive for shortness of breath (intermittent).   Psychiatric/Behavioral: The patient is nervous/anxious.   All other systems reviewed and are negative.    Physical Exam Updated Vital Signs BP (!) 145/93   Pulse 68   Temp 98.3 F (36.8 C)   Resp 11   SpO2 94%   Physical Exam  Constitutional: He is oriented to person, place, and time. He appears well-developed and well-nourished. No distress.  Resting comfortably.  Appears in no distress.  HENT:  Head: Normocephalic and atraumatic.  Mouth/Throat: Uvula is midline, oropharynx is clear and moist and mucous membranes are normal. Mucous membranes are not dry.  Eyes: Pupils are equal, round, and reactive to light. Conjunctivae and EOM are normal.  EOMI and PERRLA.  No nystagmus.  Neck: Normal range of motion. Neck supple.  Cardiovascular: Normal rate, regular rhythm and intact distal pulses.  Pulmonary/Chest: Effort normal and breath sounds normal. No respiratory distress. He has no wheezes.  speaking in full sentences.  Clear lung sounds in all fields.  No signs of respiratory distress or shortness of breath.  Abdominal: Soft. Bowel sounds are normal. He exhibits no distension and no mass. There is tenderness. There is no rebound and no guarding.  Mild tenderness palpation of epigastric abdomen.  Otherwise soft  without rigidity, guarding, distention, or tenderness.  Musculoskeletal: Normal range of motion.  Strength intact x4.  Sensation intact x4.  Radial pedal pulses intact bilaterally.  Neurological: He is alert and oriented to person, place, and time.  Skin: Skin is warm and dry.  Psychiatric: He has a normal mood and affect.  Nursing note and vitals reviewed.    ED Treatments / Results  Labs (all labs ordered are listed, but only abnormal results are displayed) Labs Reviewed  BASIC METABOLIC PANEL -  Abnormal; Notable for the following components:      Result Value   Glucose, Bld 149 (*)    All other components within normal limits  URINALYSIS, ROUTINE W REFLEX MICROSCOPIC - Abnormal; Notable for the following components:   Hgb urine dipstick SMALL (*)    All other components within normal limits  HEPATIC FUNCTION PANEL - Abnormal; Notable for the following components:   AST 45 (*)    All other components within normal limits  CBG MONITORING, ED - Abnormal; Notable for the following components:   Glucose-Capillary 111 (*)    All other components within normal limits  CBC  LIPASE, BLOOD  I-STAT TROPONIN, ED    EKG None  Radiology Dg Chest 2 View  Result Date: 02/01/2018 CLINICAL DATA:  Chest pain. EXAM: CHEST - 2 VIEW COMPARISON:  Radiographs of November 16, 2016. FINDINGS: The heart size and mediastinal contours are within normal limits. Both lungs are clear. No pneumothorax or pleural effusion is noted. The visualized skeletal structures are unremarkable. IMPRESSION: No active cardiopulmonary disease. Electronically Signed   By: Lupita Raider, M.D.   On: 02/01/2018 12:30   Ct Angio Chest Pe W/cm &/or Wo Cm  Result Date: 02/01/2018 CLINICAL DATA:  Shortness of breath. EXAM: CT ANGIOGRAPHY CHEST WITH CONTRAST TECHNIQUE: Multidetector CT imaging of the chest was performed using the standard protocol during bolus administration of intravenous contrast. Multiplanar CT image reconstructions and MIPs were obtained to evaluate the vascular anatomy. CONTRAST:  70 mL ISOVUE-370 IOPAMIDOL (ISOVUE-370) INJECTION 76% COMPARISON:  Radiographs of same day. FINDINGS: Cardiovascular: Satisfactory opacification of the pulmonary arteries to the segmental level. No evidence of pulmonary embolism. Normal heart size. No pericardial effusion. Mediastinum/Nodes: No enlarged mediastinal, hilar, or axillary lymph nodes. Thyroid gland, trachea, and esophagus demonstrate no significant findings. Lungs/Pleura:  Lungs are clear. No pleural effusion or pneumothorax. Upper Abdomen: No acute abnormality. Musculoskeletal: No chest wall abnormality. No acute or significant osseous findings. Review of the MIP images confirms the above findings. IMPRESSION: No definite evidence of pulmonary embolus. No acute abnormality seen in the chest. Electronically Signed   By: Lupita Raider, M.D.   On: 02/01/2018 14:35    Procedures Procedures (including critical care time)  Medications Ordered in ED Medications  gi cocktail (Maalox,Lidocaine,Donnatal) (30 mLs Oral Given 02/01/18 1448)  iopamidol (ISOVUE-370) 76 % injection 100 mL (100 mLs Intravenous Contrast Given 02/01/18 1408)     Initial Impression / Assessment and Plan / ED Course  I have reviewed the triage vital signs and the nursing notes.  Pertinent labs & imaging results that were available during my care of the patient were reviewed by me and considered in my medical decision making (see chart for details).     Patient presenting for evaluation of shortness of breath and vomiting.  Physical exam reassuring, patient is afebrile not tachycardic.  He appears nontoxic.  History consistent with a likely abdominal etiology, as shortness of breath improves when patient walks  around, symptoms worsen after eating, and patient reports a GERD-like picture with worsening pain and symptoms after eating spicy foods.  History of gastritis, but currently not on any medications.  Labs reassuring, initial troponin negative.  UA without infection.  No leukocytosis.  Liver function and pancreatic function reassuring.  Chest x-ray reviewed and interpreted by me, no pneumonia, pneumothorax, or effusions.  EKG without STEMI.  As patient reports this intermittent chest pain, and has a slightly tachycardic heart rate on arrival, will obtain CT to rule out PE.  CTA negative for PE.  Discussed with patient.  GI cocktail given, patient reports improvement of symptoms.  Discussed that at  this time, doubt PE, ACS, or infection.  Likely abdominal etiology and anxiety.  Patient states that his son has been doing drugs and needs to be admitted to a group home, they have been fighting frequently.  He states he has been feeling very anxious.  I discussed with patient use of Zantac to help with acid control.  Maalox as needed.  Importance of follow-up with primary care.  At this time, patient appears safe for discharge.  Return precautions given.  Patient states he understands and agrees plan.   Final Clinical Impressions(s) / ED Diagnoses   Final diagnoses:  Gastroesophageal reflux disease, esophagitis presence not specified  Anxiety    ED Discharge Orders        Ordered    ranitidine (ZANTAC) 150 MG capsule  Daily     02/01/18 1621       Mikayla Chiusano, PA-C 02/01/18 2305    Bethann Berkshire, MD 02/04/18 1009

## 2018-02-01 NOTE — ED Triage Notes (Signed)
Son/translator stated, he has been some SOB and some dizziness since one week, is only off and on.

## 2018-02-01 NOTE — Discharge Instructions (Signed)
Take zanta as prescribed.  Use maalox if you have worsening pain.  It is important that you follow up with [facility] for further evaluation.  Return to the ER if you develop persistent difficulty breathing, chest pain, or any new or concerning symptoms.

## 2018-02-05 ENCOUNTER — Emergency Department (HOSPITAL_COMMUNITY)
Admission: EM | Admit: 2018-02-05 | Discharge: 2018-02-05 | Disposition: A | Discharge status: Home / Self Care | Payer: Self-pay | Attending: Emergency Medicine | Admitting: Emergency Medicine

## 2018-02-05 ENCOUNTER — Other Ambulatory Visit: Payer: Self-pay

## 2018-02-05 ENCOUNTER — Emergency Department (HOSPITAL_COMMUNITY): Payer: Self-pay

## 2018-02-05 ENCOUNTER — Encounter (HOSPITAL_COMMUNITY): Payer: Self-pay

## 2018-02-05 DIAGNOSIS — R0789 Other chest pain: Secondary | ICD-10-CM | POA: Insufficient documentation

## 2018-02-05 LAB — I-STAT TROPONIN, ED: Troponin i, poc: 0 ng/mL (ref 0.00–0.08)

## 2018-02-05 LAB — BASIC METABOLIC PANEL
Anion gap: 9 (ref 5–15)
BUN: 12 mg/dL (ref 6–20)
CHLORIDE: 106 mmol/L (ref 98–111)
CO2: 26 mmol/L (ref 22–32)
Calcium: 9.3 mg/dL (ref 8.9–10.3)
Creatinine, Ser: 0.75 mg/dL (ref 0.61–1.24)
GFR calc Af Amer: >60 mL/min (ref >60)
GFR calc non Af Amer: >60 mL/min (ref >60)
GLUCOSE: 142 mg/dL — AB (ref 70–99)
POTASSIUM: 3.7 mmol/L (ref 3.5–5.1)
SODIUM: 141 mmol/L (ref 135–145)

## 2018-02-05 LAB — CBC
HCT: 44.8 % (ref 39.0–52.0)
HEMOGLOBIN: 15 g/dL (ref 13.0–17.0)
MCH: 31 pg (ref 26.0–34.0)
MCHC: 33.5 g/dL (ref 30.0–36.0)
MCV: 92.6 fL (ref 78.0–100.0)
Platelets: 235 10*3/uL (ref 150–400)
RBC: 4.84 MIL/uL (ref 4.22–5.81)
RDW: 11.9 % (ref 11.5–15.5)
WBC: 7.2 10*3/uL (ref 4.0–10.5)

## 2018-02-05 MED ORDER — ALPRAZOLAM 0.25 MG PO TABS: 0.2500 mg | ORAL_TABLET | Freq: Three times a day (TID) | ORAL | 0 refills | Status: DC | PRN

## 2018-02-05 NOTE — ED Provider Notes (Signed)
Patient placed in Quick Look pathway, seen and evaluated   Chief Complaint: chest pain  HPI:   Stephen Oneal is a 42 y.o. male who presents to the ED with chest pain. Patient describes the pain as pressure and states that he feels desperate.he reports feeling anxious and short of breath. Patient recently evaluated in the ED and treated for GERD. He was told to return if symptoms worsened so he did.  ROS: GI: nausea   Pulmonary: shortness of breath and chest pain  Physical Exam:  BP (!) 141/86 (BP Location: Right Arm)   Pulse 60   Temp 98.1 F (36.7 C) (Oral)   Resp 16   Ht 5\' 5"  (1.651 Oneal)   Wt 59 kg (130 lb)   SpO2 100%   BMI 21.63 kg/Oneal     Gen: No distress  Neuro: Awake and Alert  Skin: Warm and dry      Initiation of care has begun. The patient has been counseled on the process, plan, and necessity for staying for the completion/evaluation, and the remainder of the medical screening examination    Janne Napoleoneese, Stephen Mollenkopf M, NP 02/05/18 1449    Jacalyn LefevreHaviland, Julie, MD 02/05/18 1515

## 2018-02-05 NOTE — ED Provider Notes (Signed)
[facility] Provider Note   CSN: [id number] Arrival date & time: 02/05/18  1254     History   Chief Complaint Chief Complaint  Patient presents with  . Chest Pain    HPI Antonieta Perticanor Santos-Martinez is a 42 y.o. male.  42 year old male who presents with episodic episodes of chest pressure and tightness that are nonexertional and lasts from minutes to days.  Seen here recently for similar symptoms and had an evaluation including a chest CT as well as cardiac enzymes which were negative.  Patient has no significant cardiac risk factors and has been under a great deal of stress recently due to family issues.  He denies any persistent cough or fever.  No CHF type symptoms.  Nothing makes his symptoms better or worse.  No treatment used for prior to arrival.     History reviewed. No pertinent past medical history.  There are no active problems to display for this patient.   History reviewed. No pertinent surgical history.      Home Medications    Prior to Admission medications   Medication Sig Start Date End Date Taking? Authorizing Provider  ibuprofen (ADVIL,MOTRIN) 200 MG tablet Take 200 mg by mouth every 6 (six) hours as needed (for pain).    [provider]  ranitidine (ZANTAC) 150 MG capsule Take 1 capsule (150 mg total) by mouth daily. 02/01/18   Caccavale, Sophia, PA-C    Family History No family history on file.  Social History Social History   Tobacco Use  . Smoking status: Never Smoker  . Smokeless tobacco: Never Used  Substance Use Topics  . Alcohol use: Yes    Alcohol/week: 8.4 oz    Types: 14 Cans of beer per week    Comment: daily  . Drug use: No     Allergies   Patient has no known allergies.   Review of Systems Review of Systems  All other systems reviewed and are negative.    Physical Exam Updated Vital Signs BP 134/89   Pulse 68   Temp 98.1 F (36.7 C) (Oral)   Resp 14   Ht 1.651 m (5'  5")   Wt 59 kg (130 lb)   SpO2 100%   BMI 21.63 kg/m   Physical Exam  Constitutional: He is oriented to person, place, and time. He appears well-developed and well-nourished.  Non-toxic appearance. No distress.  HENT:  Head: Normocephalic and atraumatic.  Eyes: Pupils are equal, round, and reactive to light. Conjunctivae, EOM and lids are normal.  Neck: Normal range of motion. Neck supple. No tracheal deviation present. No thyroid mass present.  Cardiovascular: Normal rate, regular rhythm and normal heart sounds. Exam reveals no gallop.  No murmur heard. Pulmonary/Chest: Effort normal and breath sounds normal. No stridor. No respiratory distress. He has no decreased breath sounds. He has no wheezes. He has no rhonchi. He has no rales.  Abdominal: Soft. Normal appearance and bowel sounds are normal. He exhibits no distension. There is no tenderness. There is no rebound and no CVA tenderness.  Musculoskeletal: Normal range of motion. He exhibits no edema or tenderness.  Neurological: He is alert and oriented to person, place, and time. He has normal strength. No cranial nerve deficit or sensory deficit. GCS eye subscore is 4. GCS verbal subscore is 5. GCS motor subscore is 6.  Skin: Skin is warm and dry. No abrasion and no rash noted.  Psychiatric: He has a normal mood and affect. His  speech is normal and behavior is normal.  Nursing note and vitals reviewed.    ED Treatments / Results  Labs (all labs ordered are listed, but only abnormal results are displayed) Labs Reviewed  BASIC METABOLIC PANEL - Abnormal; Notable for the following components:      Result Value   Glucose, Bld 142 (*)    All other components within normal limits  CBC  I-STAT TROPONIN, ED    EKG EKG Interpretation  Date/Time:  Friday February 05 2018 13:25:19 EDT Ventricular Rate:  65 PR Interval:  174 QRS Duration: 76 QT Interval:  394 QTC Calculation: 409 R Axis:   19 Text Interpretation:  Normal sinus  rhythm Normal ECG Confirmed by Lorre Nick ([id number]) on 02/05/2018 3:19:58 PM   Radiology Dg Chest 2 View  Result Date: 02/05/2018 CLINICAL DATA:  Chest pain EXAM: CHEST - 2 VIEW COMPARISON:  Chest radiograph and chest CT February 01, 2018 FINDINGS: No edema or consolidation. There is an apparent nipple shadow on each side. Heart size and pulmonary vascularity are normal. No adenopathy. No bone lesions. IMPRESSION: No edema or consolidation. The parent nipple shadows bilaterally. Stable cardiac silhouette. Electronically Signed   By: Bretta Bang III M.D.   On: 02/05/2018 13:59    Procedures Procedures (including critical care time)  Medications Ordered in ED Medications - No data to display   Initial Impression / Assessment and Plan / ED Course  I have reviewed the triage vital signs and the nursing notes.  Pertinent labs & imaging results that were available during my care of the patient were reviewed by me and considered in my medical decision making (see chart for details).    Patient's work-up reviewed here and EKG is unchanged.  Chest x-ray without acute findings.  Cardiac enzymes negative.  Low suspicion for ACS.  Heart score is about 1.  Suspect component of anxiety and panic with this.  Will prescribe PRN Xanax and will give referrals.   Final Clinical Impressions(s) / ED Diagnoses   Final diagnoses:  None    ED Discharge Orders    None       Lorre Nick, MD 02/05/18 1544

## 2018-02-05 NOTE — ED Triage Notes (Signed)
Pt c/o anxiety, CP, and shaking with no improvement since being seen here on Monday.

## 2018-02-18 ENCOUNTER — Encounter: Payer: Self-pay | Admitting: General Practice

## 2018-02-18 ENCOUNTER — Ambulatory Visit: Payer: Self-pay | Attending: Family Medicine | Admitting: Physician Assistant

## 2018-02-18 VITALS — BP 131/85 | HR 82 | Temp 98.2°F | Resp 18 | Ht 63.0 in | Wt 126.0 lb

## 2018-02-18 DIAGNOSIS — R739 Hyperglycemia, unspecified: Secondary | ICD-10-CM

## 2018-02-18 DIAGNOSIS — R030 Elevated blood-pressure reading, without diagnosis of hypertension: Secondary | ICD-10-CM

## 2018-02-18 DIAGNOSIS — Z789 Other specified health status: Secondary | ICD-10-CM

## 2018-02-18 DIAGNOSIS — F419 Anxiety disorder, unspecified: Secondary | ICD-10-CM

## 2018-02-18 DIAGNOSIS — Z09 Encounter for follow-up examination after completed treatment for conditions other than malignant neoplasm: Secondary | ICD-10-CM

## 2018-02-18 DIAGNOSIS — Z79899 Other long term (current) drug therapy: Secondary | ICD-10-CM | POA: Insufficient documentation

## 2018-02-18 LAB — GLUCOSE, POCT (MANUAL RESULT ENTRY): POC Glucose: 106 mg/dl — AB (ref 70–99)

## 2018-02-18 MED ORDER — BUSPIRONE HCL 10 MG PO TABS: 10.0000 mg | ORAL_TABLET | Freq: Two times a day (BID) | ORAL | 3 refills | Status: DC

## 2018-02-18 MED FILL — busPIRone HCL 10 MG TABS: 10 | 30 days supply | Qty: 60 | Fill #0

## 2018-02-18 NOTE — Patient Instructions (Signed)
Check blood pressure out of the office 3 to 5 times and record and bring to next visit.

## 2018-02-18 NOTE — Progress Notes (Signed)
Patient ID: Stephen Oneal, male   DOB: March 13, 1976, 42 y.o.   MRN: [id number]     Stephen Oneal, is a 42 y.o. male  [id number]  [id number]  DOB - March 13, 1976  Subjective:  Chief Complaint and HPI: Stephen Oneal is a 42 y.o. male here today to establish care and for a follow up visit Seen in ED 02/05/2018 for CP.  He has had some anxiety since these ED visits.  Xanax alleviates s/sx.  When he gets anxious, he feels shaky, pressure across his entire chest, and SOB which tends to lessen if his wife talks to him/prays over him/rubs alcohol on his chest. Symptoms also lessen if he gets up and walks around and exerts himself.  No radiating pain.  No substernal pain.  Wife is with him and gives much of history.  Rafael with [employer] interpre[employer] translating.  BP has been up and normal out of the office.  Hyperglycemia was noted on chart review.  No FH early/sudden cardiac events.    From ED notes: 42 year old male who presents with episodic episodes of chest pressure and tightness that are nonexertional and lasts from minutes to days.  Seen here recently for similar symptoms and had an evaluation including a chest CT as well as cardiac enzymes which were negative.  Patient has no significant cardiac risk factors and has been under a great deal of stress recently due to family issues.  He denies any persistent cough or fever.  No CHF type symptoms.  Nothing makes his symptoms better or worse.  No treatment used for prior to arrival.  EKG: EKG Interpretation  Date/Time:                  Friday February 05 2018 13:25:19 EDT Ventricular Rate:         65 PR Interval:                 174 QRS Duration: 76 QT Interval:                 394 QTC Calculation:        409 R Axis:                         19 Text Interpretation:       Normal sinus rhythm Normal ECG Confirmed by Lorre NickAllen, Anthony   From A/P: Pertinent labs & imaging results that were available during my care of the  patient were reviewed by me and considered in my medical decision making (see chart for details).  Patient's work-up reviewed here and EKG is unchanged.  Chest x-ray without acute findings.  Cardiac enzymes negative.  Low suspicion for ACS.  Heart score is about 1.  Suspect component of anxiety and panic with this.  Will prescribe PRN Xanax and will give referrals.  Rafael with [employer] interpre[employer] translating.   ED/Hospital notes reviewed.   Social History:  Married, drives a fork lift at work Family history:  No FH early MI.  + DM, and htn in family  ROS:   Constitutional:  No f/c, No night sweats, No unexplained weight loss. EENT:  No vision changes, No blurry vision, No hearing changes. No mouth, throat, or ear problems.  Respiratory: No cough, No SOB Cardiac: + CP as described above, no palpitations GI:  No abd pain, No N/V/D. GU: No Urinary s/sx Musculoskeletal: No joint pain Neuro: No headache, no dizziness, no motor weakness.  Skin: No rash Endocrine:  No polydipsia. No polyuria.  Psych: Denies SI/HI, + anxiety  No problems updated.  ALLERGIES: No Known Allergies  PAST MEDICAL HISTORY: History reviewed. No pertinent past medical history.  MEDICATIONS AT HOME: Prior to Admission medications   Medication Sig Start Date End Date Taking? Authorizing Provider  ALPRAZolam (XANAX) 0.25 MG tablet Take 1 tablet (0.25 mg total) by mouth 3 (three) times daily as needed for anxiety. 02/05/18  Yes Lorre Nick, MD  ibuprofen (ADVIL,MOTRIN) 200 MG tablet Take 200 mg by mouth every 6 (six) hours as needed (for pain).   Yes [provider]  ranitidine (ZANTAC) 150 MG capsule Take 1 capsule (150 mg total) by mouth daily. 02/01/18  Yes Caccavale, Sophia, PA-C  busPIRone (BUSPAR) 10 MG tablet Take 1 tablet (10 mg total) by mouth 2 (two) times daily. 02/18/18   Anders Simmonds, PA-C     Objective:  EXAM:   Vitals:   02/18/18 0856  BP: 131/85  Pulse: 82  Resp: 18  Temp:  98.2 F (36.8 C)  TempSrc: Oral  SpO2: 97%  Weight: 126 lb (57.2 kg)  Height: 5\' 3"  (1.6 m)    General appearance : A&OX3. NAD. Non-toxic-appearing HEENT: Atraumatic and Normocephalic.  PERRLA. EOM intact.   Neck: supple, no JVD. No cervical lymphadenopathy. No thyromegaly Chest/Lungs:  Breathing-non-labored, Good air entry bilaterally, breath sounds normal without rales, rhonchi, or wheezing  CVS: S1 S2 regular, no murmurs, gallops, rubs  Extremities: Bilateral Lower Ext shows no edema, both legs are warm to touch with = pulse throughout Neurology:  CN II-XII grossly intact, Non focal.   Psych:  TP linear. J/I WNL. Normal speech. Appropriate eye contact and affect.  Skin:  No Rash  Data Review No results found for: HGBA1C   Assessment & Plan   1. Hyperglycemia Eliminate/limit sugar/white carbohydrates/alcohol from diet - Glucose (CBG) - Hemoglobin A1c  2. Elevated blood pressure reading Check blood pressure 3-5 times weekly and record and bring to next visit-may need meds  3. Encounter for examination following treatment at hospital No further s/sx.    4. Anxiety Trial of buspar 10mg  bid.  Stop xanax as this is not a good long term solution but it did help his symptoms - Vitamin D, 25-hydroxy - TSH  5. [employer] interpre[employer] used and additional time performing visit was required.   Patient have been counseled extensively about nutrition and exercise  Return in about 1 month (around 03/21/2018) for assign PCP; follow up BP, sugar, and anxiety,.  The patient was given clear instructions to go to ER or return to medical center if symptoms don't improve, worsen or new problems develop. The patient verbalized understanding. The patient was told to call to get lab results if they haven't heard anything in the next week.     Georgian Co, PA-C [facility] and [facility] [place], [place] [phone]   02/18/2018, 9:09 AM

## 2018-02-19 LAB — HEMOGLOBIN A1C
Est. average glucose Bld gHb Est-mCnc: 117 mg/dL
HEMOGLOBIN A1C: 5.7 % — AB (ref 4.8–5.6)

## 2018-02-19 LAB — VITAMIN D 25 HYDROXY (VIT D DEFICIENCY, FRACTURES): Vit D, 25-Hydroxy: 32.4 ng/mL (ref 30.0–100.0)

## 2018-02-19 LAB — TSH: TSH: 4.63 u[IU]/mL — ABNORMAL HIGH (ref 0.450–4.500)

## 2018-03-01 ENCOUNTER — Ambulatory Visit: Payer: Self-pay | Attending: Nurse Practitioner

## 2018-03-29 ENCOUNTER — Ambulatory Visit: Payer: Self-pay | Attending: Nurse Practitioner | Admitting: Nurse Practitioner

## 2018-03-29 ENCOUNTER — Encounter: Payer: Self-pay | Admitting: Nurse Practitioner

## 2018-03-29 VITALS — BP 157/98 | HR 72 | Temp 99.0°F | Ht 63.0 in | Wt 133.6 lb

## 2018-03-29 DIAGNOSIS — K219 Gastro-esophageal reflux disease without esophagitis: Secondary | ICD-10-CM

## 2018-03-29 DIAGNOSIS — Z79899 Other long term (current) drug therapy: Secondary | ICD-10-CM | POA: Insufficient documentation

## 2018-03-29 DIAGNOSIS — R7303 Prediabetes: Secondary | ICD-10-CM

## 2018-03-29 DIAGNOSIS — F419 Anxiety disorder, unspecified: Secondary | ICD-10-CM

## 2018-03-29 DIAGNOSIS — I10 Essential (primary) hypertension: Secondary | ICD-10-CM

## 2018-03-29 LAB — GLUCOSE, POCT (MANUAL RESULT ENTRY): POC GLUCOSE: 100 mg/dL — AB (ref 70–99)

## 2018-03-29 MED ORDER — CLONIDINE HCL 0.1 MG PO TABS
0.2000 mg | ORAL_TABLET | Freq: Once | ORAL | Status: AC
  Administered 2018-03-29: 0.2 mg via ORAL

## 2018-03-29 MED ORDER — RANITIDINE HCL 150 MG PO CAPS: 150.0000 mg | ORAL_CAPSULE | Freq: Every day | ORAL | 0 refills | Status: DC

## 2018-03-29 MED ORDER — RANITIDINE HCL 150 MG PO CAPS: 150.0000 mg | ORAL_CAPSULE | Freq: Every day | ORAL | 1 refills | Status: DC

## 2018-03-29 MED ORDER — BUSPIRONE HCL 10 MG PO TABS: 10.0000 mg | ORAL_TABLET | Freq: Two times a day (BID) | ORAL | 3 refills | Status: DC

## 2018-03-29 MED FILL — raNITIdine HCL 150 MG TABS: 150 | 30 days supply | Qty: 30 | Fill #0

## 2018-03-29 MED FILL — busPIRone HCL 10 MG TABS: 10 | 30 days supply | Qty: 60 | Fill #0

## 2018-03-29 NOTE — Patient Instructions (Signed)
Plan de alimentacin DASH DASH Eating Plan DASH es la sigla en ingls de "Enfoques Alimentarios para Detener la Hipertensin" (Dietary Approaches to Stop Hypertension). El plan de alimentacin DASH ha demostrado bajar la presin arterial elevada (hipertensin). Tambin puede reducir el riesgo de diabetes tipo 2, enfermedad cardaca y accidente cerebrovascular. Este plan tambin puede ayudar a adelgazar. Consejos para seguir este plan [place] generales  Evite ingerir ms de 2,300 mg (miligramos) de sal (sodio) por da. Si tiene hipertensin, es posible que necesite reducir la ingesta de sodio a 1,500 mg por da.  Limite el consumo de alcohol a no ms de 1medida por da si es mujer y no est embarazada, y 2medidas por da si es hombre. Una medida equivale a 12oz (355ml) de cerveza, 5oz (148ml) de vino o 1oz (44ml) de bebidas alcohlicas de alta graduacin.  Trabaje con su mdico para mantener un peso saludable o perder peso. Pregntele cul es el peso recomendado para usted.  Realice al menos 30 minutos de ejercicio que haga que se acelere su corazn (ejercicio aerbico) la mayora de los das de la semana. Estas actividades pueden incluir caminar, nadar o andar en bicicleta.  Trabaje con su mdico o especialista en alimentacin y nutricin (nutricionista) para ajustar su plan alimentario a sus necesidades calricas personales. Lectura de las etiquetas de los alimentos  Verifique en las etiquetas de los alimentos, la cantidad de sodio por porcin. Elija alimentos con menos del 5 por ciento del valor diario de sodio. Generalmente, los alimentos con menos de 300 mg de sodio por porcin se encuadran dentro de este plan alimentario.  Para encontrar cereales integrales, busque la palabra "integral" como primera palabra en la lista de ingredientes. De compras  Compre productos en los que en su etiqueta diga: "bajo contenido de sodio" o "sin agregado de sal".  Compre alimentos frescos. Evite  los alimentos enlatados y comidas precocidas o congeladas. Coccin  Evite agregar sal cuando cocine. Use hierbas o aderezos sin sal, en lugar de sal de mesa o sal marina. Consulte al mdico o farmacutico antes de usar sustitutos de la sal.  No fra los alimentos. A la hora de cocinar los alimentos opte por hornearlos, hervirlos, grillarlos y asarlos a la parrilla.  Cocine con aceites cardiosaludables, como oliva, canola, soja o girasol. Planificacin de las comidas   Consuma una dieta equilibrada, que incluya lo siguiente: ? 5o ms porciones de frutas y verduras por da. Trate de que la mitad del plato de cada comida sean frutas y verduras. ? Hasta 6 u 8 porciones de cereales integrales por da. ? Menos de 6 onzas de carne, aves o pescado magros por da. Una porcin de 3 onzas de carne tiene casi el mismo tamao que un mazo de cartas. Un huevo equivale a 1 onza. ? Dos porciones de productos lcteos descremados por da. ? Una porcin de frutos secos, semillas o frijoles 5 veces por semana. ? Grasas cardiosaludables. Las grasas saludables llamadas cidos grasos omega-3 se encuentran en alimentos como semillas de lino y pescados de agua fra, como por ejemplo, sardinas, salmn y caballa.  Limite la cantidad que ingiere de los siguientes alimentos: ? Alimentos enlatados o envasados. ? Alimentos con alto contenido de grasa trans, como alimentos fritos. ? Alimentos con alto contenido de grasa saturada, como carne con grasa. ? Dulces, postres, bebidas azucaradas y otros alimentos con azcar agregada. ? Productos lcteos enteros.  No le agregue sal a los alimentos antes de probarlos.  Trate de comer al   menos 2 comidas vegetarianas por semana.  Consuma ms comida casera y menos de restaurante, de bufs y comida rpida.  Cuando coma en un restaurante, pida que preparen su comida con menos sal o, en lo posible, sin nada de sal. Qu alimentos se recomiendan? Los alimentos enumerados a  continuacin no constituyen una lista completa. Hable con el nutricionista sobre las mejores opciones alimenticias para usted. Cereales Pan de salvado o integral. Pasta de salvado o integral. Arroz integral. Avena. Quinua. Trigo burgol. Cereales integrales y con bajo contenido de sodio. Pan pita. Galletitas de agua con bajo contenido de grasa y sodio. Tortillas de harina integral. Verduras Verduras frescas o congeladas (crudas, al vapor, asadas o grilladas). Jugos de tomate y verduras con bajo contenido de sodio o reducidos en sodio. Salsa y pasta de tomate con bajo contenido de sodio o reducidas en sodio. Verduras enlatadas con bajo contenido de sodio o reducidas en sodio. Frutas Todas las frutas frescas, congeladas o disecadas. Frutas enlatadas en jugo natural (sin agregado de azcar). Carne y otros alimentos proteicos Pollo o pavo sin piel. Carne de pollo o de pavo molida. Cerdo desgrasado. Pescado y mariscos. Claras de huevo. Porotos, guisantes o lentejas secos. Frutos secos, mantequilla de frutos secos y semillas sin sal. Frijoles enlatados sin sal. Cortes de carne vacuna magra, desgrasada. Embutidos magros, con bajo contenido de sodio. Lcteos Leche descremada (1%) o descremada. Quesos sin grasa, con bajo contenido de grasa o descremados. Queso blanco o ricota sin grasa, con bajo contenido de sodio. Yogur semidescremado o descremado. Queso con bajo contenido de grasa y sodio. Grasas y aceites Margarinas untables que no contengan grasas trans. Aceite vegetal. Mayonesa y aderezos para ensaladas livianos o con bajo contenido de grasas (reducidos en sodio). Aceite de canola, crtamo, oliva, soja y girasol. [place]. Condimentos y otros alimentos Hierbas. Especias. Mezclas de condimentos sin sal. Palomitas de maz y pretzels sin sal. Dulces con bajo contenido de grasas. Qu alimentos no se recomiendan? Los alimentos enumerados a continuacin no constituyen una lista completa. Hable con el  nutricionista sobre las mejores opciones alimenticias para usted. Cereales Productos de panificacin hechos con grasa, como medialunas, magdalenas y algunos panes. Comidas con arroz o pasta seca listas para usar. Verduras Verduras con crema o fritas. Verduras en salsa de queso. Verduras enlatadas regulares (que no sean con bajo contenido de sodio o reducidas en sodio). Pasta y salsa de tomates enlatadas regulares (que no sean con bajo contenido de sodio o reducidas en sodio). Jugos de tomate y verduras regulares (que no sean con bajo contenido de sodio o reducidos en sodio). Pepinillos. Aceitunas. Frutas Fruta enlatada en almbar liviano o espeso. Frutas cocidas en aceite. Frutas con salsa de crema o manteca. Carne y otros alimentos proteicos Cortes de carne con grasa. Costillas. Carne frita. Tocino. Salchichas. Mortadela y otras carnes procesadas. Salame. Panceta. Perros calientes (hotdogs). Salchicha de cerdo. Frutos secos y semillas con sal. Frijoles enlatados con agregado de sal. Pescado enlatado o ahumado. Huevos enteros o yemas. Pollo o pavo con piel. Lcteos Leche entera o al 2%, crema y mitad leche y mitad crema. Queso crema entero o con toda su grasa. Yogur entero o endulzado. Quesos con toda su grasa. Sustitutos de cremas no lcteas. Coberturas batidas. Quesos para untar y quesos procesados. Grasas y aceites Mantequilla. Margarina en barra. Manteca de cerdo. Materia grasa. Mantequilla clarificada. Grasa de panceta. Aceites tropicales como aceite de coco, palmiste o palma. Condimentos y otros alimentos Palomitas de maz y pretzels con sal. Sal de   cebolla, sal de ajo, sal condimentada, sal de mesa y sal marina. Salsa [place]. Salsa trtara. Salsa barbacoa. Salsa teriyaki. Salsa de soja, incluso la que tiene contenido reducido de sodio. Salsa de carne. Salsas en lata y envasadas. Salsa de pescado. Salsa de ostras. Salsa rosada. Rbano picante envasado. [place]. Mostaza. Saborizantes y  tiernizantes para carne. Caldo en cubitos. Salsa picante y salsa tabasco. Escabeches envasados o ya preparados. Aderezos para tacos prefabricados o envasados. Salsas. Aderezos comunes para ensalada. Dnde encontrar ms informacin:  Instituto Nacional del Corazn, los Pulmones y la Sangre (National Heart, Lung, and Blood Institute): www.nhlbi.nih.gov  Asociacin Estadounidense del Corazn (American Heart Association): www.heart.org Resumen  El plan de alimentacin DASH ha demostrado bajar la presin arterial elevada (hipertensin). Tambin puede reducir el riesgo de diabetes tipo 2, enfermedad cardaca y accidente cerebrovascular.  Con el plan de alimentacin DASH, deber limitar el consumo de sal (sodio) a 2,300 mg por da. Si tiene hipertensin, es posible que necesite reducir la ingesta de sodio a 1,500 mg por da.  Cuando siga el plan de alimentacin DASH, trate de comer ms frutas frescas y verduras, cereales integrales, carnes magras, lcteos descremados y grasas cardiosaludables.  Trabaje con su mdico o especialista en alimentacin y nutricin (nutricionista) para ajustar su plan alimentario a sus necesidades calricas personales. Esta informacin no tiene como fin reemplazar el consejo del mdico. Asegrese de hacerle al mdico cualquier pregunta que tenga. Document Released: 07/10/2011 Document Revised: 11/10/2016 Document Reviewed: 11/10/2016 Elsevier Interactive Patient Education  2018 Elsevier Inc.  Hipertensin Hypertension El trmino hipertensin es otra forma de denominar a la presin arterial elevada. La presin arterial elevada fuerza al corazn a trabajar ms para bombear la sangre. Esto puede causar problemas con el paso del tiempo. Una lectura de presin arterial est compuesta por 2 nmeros. Hay un nmero superior (sistlico) sobre un nmero inferior (diastlico). Lo ideal es tener la presin arterial por debajo de 120/80. Las decisiones saludables pueden ayudarle a  disminuir su presin arterial. Es posible que necesite medicamentos que le ayuden a disminuir su presin arterial si:  Su presin arterial no disminuye mediante decisiones saludables.  Su presin arterial est por encima de 130/80.  Siga estas instrucciones en su casa: Comida y bebida  Si se lo indican, siga el plan de alimentacin de DASH (Dietary Approaches to Stop Hypertension, Maneras de alimentarse para detener la hipertensin). Esta dieta incluye: ? Que la mitad del plato de cada comida sea de frutas y verduras. ? Que un cuarto del plato de cada comida sea de cereales integrales. Los cereales integrales incluyen pasta integral, arroz integral y pan integral. ? Comer y beber productos lcteos con bajo contenido de grasa, como leche descremada o yogur bajo en grasas. ? Que un cuarto del plato de cada comida sea de protenas bajas en grasa (magras). Las protenas bajas en grasa incluyen pescado, pollo sin piel, huevos, frijoles y tofu. ? Evitar consumir carne grasa, carne curada y procesada, o pollo con piel. ? Evitar consumir alimentos prehechos o procesados.  Consuma menos de 1500 mg de sal (sodio) por da.  Limite el consumo de alcohol a no ms de 1 medida por da si es mujer y no est embarazada y a 2 medidas por da si es hombre. Una medida equivale a 12onzas de cerveza, 5onzas de vino o 1onzas de bebidas alcohlicas de alta graduacin. Estilo de vida  Trabaje con su mdico para mantenerse en un peso saludable o para perder peso. Pregntele a su mdico cul   es el peso recomendable para usted.  Realice al menos 30 minutos de ejercicio que haga que se acelere su corazn (ejercicio aerbico) la mayora de los das de la semana. Estos pueden incluir caminar, nadar o andar en bicicleta.  Realice al menos 30 minutos de ejercicio que fortalezca sus msculos (ejercicios de resistencia) al menos 3 das a la semana. Estos pueden incluir levantar pesas o hacer pilates.  No consuma ningn  producto que contenga nicotina o tabaco. Esto incluye cigarrillos y cigarrillos electrnicos. Si necesita ayuda para dejar de fumar, consulte al mdico.  Controle su presin arterial en su casa tal como le indic el mdico.  Concurra a todas las visitas de control como se lo haya indicado el mdico. Esto es importante. Medicamentos  Tome los medicamentos de venta libre y los recetados solamente como se lo haya indicado el mdico. Siga cuidadosamente las indicaciones.  No omita las dosis de medicamentos para la presin arterial. Los medicamentos pierden eficacia si omite dosis. El hecho de omitir las dosis tambin aumenta el riesgo de otros problemas.  Pregntele a su mdico a qu efectos secundarios o reacciones a los medicamentos debe prestar atencin. Comunquese con un mdico si:  Piensa que tiene una reaccin a los medicamentos que est tomando.  Tiene dolores de cabeza frecuentes (recurrentes).  Siente mareos.  Tiene hinchazn en los tobillos.  Tiene problemas de visin. Solicite ayuda de inmediato si:  Siente un dolor de cabeza muy intenso.  Comienza a sentirse confundido.  Se siente dbil o adormecido.  Siente que va a desmayarse.  Siente un dolor muy intenso en: ? El pecho. ? El vientre (abdomen).  Devuelve (vomita) ms de una vez.  Tiene dificultad para respirar. Resumen  El trmino hipertensin es otra forma de denominar a la presin arterial elevada.  Las decisiones saludables pueden ayudarle a disminuir su presin arterial. Si no puede controlar su presin arterial mediante decisiones saludables, es posible que deba tomar medicamentos. Esta informacin no tiene como fin reemplazar el consejo del mdico. Asegrese de hacerle al mdico cualquier pregunta que tenga. Document Released: 01/08/2010 Document Revised: 07/02/2016 Document Reviewed: 07/02/2016 Elsevier Interactive Patient Education  2018 Elsevier Inc.  

## 2018-03-29 NOTE — Progress Notes (Signed)
Assessment & Plan:  Stephen Oneal was seen today for establish care.  Diagnoses and all orders for this visit:  Essential hypertension -     cloNIDine (CATAPRES) tablet 0.2 mg Continue all antihypertensives as prescribed.  Remember to bring in your blood pressure log with you for your follow up appointment.  DASH/Mediterranean Diets are healthier choices for HTN.  We will recheck blood pressure at your next office visit  Prediabetes -     Glucose (CBG) Continue blood sugar control as discussed in office today, low carbohydrate diet, and regular physical exercise as tolerated, 150 minutes per week (30 min each day, 5 days per week, or 50 min 3 days per week).  Annual eye exams and foot exams are recommended.   Anxiety -     busPIRone (BUSPAR) 10 MG tablet; Take 1 tablet (10 mg total) by mouth 2 (two) times daily.  Gastroesophageal reflux disease, esophagitis presence not specified -     ranitidine (ZANTAC) 150 MG capsule; Take 1 capsule (150 mg total) by mouth daily. INSTRUCTIONS: Avoid GERD Triggers: acidic, spicy or fried foods, caffeine, coffee, sodas,  alcohol and chocolate.    Patient has been counseled on age-appropriate routine health concerns for screening and prevention. These are reviewed and up-to-date. Referrals have been placed accordingly. Immunizations are up-to-date or declined.    Subjective:   Chief Complaint  Patient presents with  . Establish Care    Pt. is here to establish care.    HPI Stephen Oneal 42 y.o. male presents to office today to establish care. VRI was used to communicate directly with patient for the entire encounter including providing detailed patient instructions. He has a history of prediabetes, anxiety and gastritis.    Prediabetes Currently stable. He denies any hypo or hyperglycemic symptoms. Currently diet controlled. He does not monitor his blood glucose levels at home.  Lab Results  Component Value Date   HGBA1C 5.7 (H)  02/18/2018    Elevated Blood Pressure His blood pressure is very elevated today.  He required 0.2 mg of clonidine here in the office.  He denies ever being diagnosed with hypertension in the past. He states he had a pork sandwich to eat today. I have instructed him to eliminate pork and foods high in sodium from his diet as this causes increased blood pressure. He currently denies chest pain, shortness of breath, palpitations, lightheadedness, dizziness, headaches or BLE edema.  BP Readings from Last 3 Encounters:  03/29/18 (!) 157/98  02/18/18 131/85  02/05/18 (!) 144/101   GERD He was seen in the ED on 02-01-2018 with complaints of intermittent shortness of breath. Symptoms were occurring after eating spicy foods. He also endorsed a history of gastritis which was treated with a medication he could not recall. At that time based on his presenting symptoms he was prescribed ranitidine 150 mg. Today he reports his symptoms have lessened since taking the ranitidine however he has currently run out of the medication. I will refill today and he will continue on ranitidine for the next 2 months. Then I will dc if stable.  Pain factors: Spicy, acidic, or fried foods.   Anxiety He is currently taking xanax and buspar.  Alprazolam was prescribed after ED visit on 02/05/2018 for atypical chest pressure and tightness with negative cardiac work-up.  I have instructed him that he should only be taking one anxiolytic and I do not prescribe benzodiazepines. He verbalized understanding.  I will refill buspar only today.  GAD 7 :  Generalized Anxiety Score 02/18/2018  Nervous, Anxious, on Edge 3  Control/stop worrying 3  Worry too much - different things 3  Trouble relaxing 3  Restless 3  Easily annoyed or irritable 2  Afraid - awful might happen 2  Total GAD 7 Score 19     Review of Systems  Constitutional: Negative for fever, malaise/fatigue and weight loss.  HENT: Negative.  Negative for nosebleeds.     Eyes: Negative.  Negative for blurred vision, double vision and photophobia.  Respiratory: Negative.  Negative for cough and shortness of breath.   Cardiovascular: Negative.  Negative for chest pain, palpitations and leg swelling.  Gastrointestinal: Positive for heartburn. Negative for nausea and vomiting.  Musculoskeletal: Negative.  Negative for myalgias.  Neurological: Negative.  Negative for dizziness, focal weakness, seizures and headaches.  Psychiatric/Behavioral: Negative for suicidal ideas. The patient is nervous/anxious.     Past Medical History:  Diagnosis Date  . Anxiety   . Prediabetes     History reviewed. No pertinent surgical history.  Family History  Problem Relation Age of Onset  . Diabetes Neg Hx   . Hypertension Neg Hx     Social History Reviewed with no changes to be made today.   Outpatient Medications Prior to Visit  Medication Sig Dispense Refill  . ibuprofen (ADVIL,MOTRIN) 200 MG tablet Take 200 mg by mouth every 6 (six) hours as needed (for pain).    Marland Kitchen. ALPRAZolam (XANAX) 0.25 MG tablet Take 1 tablet (0.25 mg total) by mouth 3 (three) times daily as needed for anxiety. (Patient not taking: Reported on 03/29/2018) 15 tablet 0  . busPIRone (BUSPAR) 10 MG tablet Take 1 tablet (10 mg total) by mouth 2 (two) times daily. (Patient not taking: Reported on 03/29/2018) 60 tablet 3  . ranitidine (ZANTAC) 150 MG capsule Take 1 capsule (150 mg total) by mouth daily. (Patient not taking: Reported on 03/29/2018) 30 capsule 0   No facility-administered medications prior to visit.     No Known Allergies     Objective:    BP (!) 157/98 (BP Location: Left Arm, Patient Position: Sitting, Cuff Size: Normal)   Pulse 72   Temp 99 F (37.2 C) (Oral)   Ht 5\' 3"  (1.6 m)   Wt 133 lb 9.6 oz (60.6 kg)   SpO2 99%   BMI 23.67 kg/m  Wt Readings from Last 3 Encounters:  03/29/18 133 lb 9.6 oz (60.6 kg)  02/18/18 126 lb (57.2 kg)  02/05/18 130 lb (59 kg)    Physical Exam   Constitutional: He is oriented to person, place, and time. He appears well-developed and well-nourished. He is cooperative.  HENT:  Head: Normocephalic and atraumatic.  Eyes: EOM are normal.  Neck: Normal range of motion.  Cardiovascular: Normal rate, regular rhythm and normal heart sounds. Exam reveals no gallop and no friction rub.  No murmur heard. Pulmonary/Chest: Effort normal and breath sounds normal. No tachypnea. No respiratory distress. He has no decreased breath sounds. He has no wheezes. He has no rhonchi. He has no rales. He exhibits no tenderness.  Abdominal: Soft. Bowel sounds are normal.  Musculoskeletal: Normal range of motion. He exhibits no edema.  Neurological: He is alert and oriented to person, place, and time. Coordination normal.  Skin: Skin is warm and dry.  Psychiatric: He has a normal mood and affect. His behavior is normal. Judgment and thought content normal.  Nursing note and vitals reviewed.        Patient has been counseled  extensively about nutrition and exercise as well as the importance of adherence with medications and regular follow-up. The patient was given clear instructions to go to ER or return to medical center if symptoms don't improve, worsen or new problems develop. The patient verbalized understanding.   Follow-up: Return in about 2 weeks (around 04/12/2018) for BP recheck .   Claiborne Rigg, FNP-BC [facility] [place], [place] [phone]   03/30/2018, 6:52 PM

## 2018-04-12 ENCOUNTER — Ambulatory Visit: Payer: Self-pay | Attending: Family Medicine | Admitting: Pharmacist

## 2018-04-14 ENCOUNTER — Encounter: Payer: Self-pay | Admitting: Pharmacist

## 2018-04-21 ENCOUNTER — Encounter: Payer: Self-pay | Admitting: Pharmacist

## 2018-04-28 ENCOUNTER — Ambulatory Visit: Payer: Self-pay | Attending: Nurse Practitioner | Admitting: Pharmacist

## 2018-04-28 ENCOUNTER — Encounter: Payer: Self-pay | Admitting: Pharmacist

## 2018-04-28 VITALS — BP 144/84 | HR 71

## 2018-04-28 DIAGNOSIS — R030 Elevated blood-pressure reading, without diagnosis of hypertension: Secondary | ICD-10-CM

## 2018-04-28 DIAGNOSIS — Z87891 Personal history of nicotine dependence: Secondary | ICD-10-CM | POA: Insufficient documentation

## 2018-04-28 NOTE — Progress Notes (Signed)
   S:  PCP: Bertram [place]   Patient arrives in good spirits. Presents to the clinic for BP re-check. Patient was referred and last seen by Zelda on 03/29/18. BP was elevated 173/106. 0/2 mg of clonidine given and BP came down to 157/98. DASH diet reinforced. No anti-hypertensives ordered at that time.    Patient denies CP, SOB, HA, or blurred vision. No dizziness or BLE edema.   Patient does not take medication for blood pressure at this time.   Dietary habits include:  - Pt limits salt - Pt does not consume caffeine  Exercise habits include: - reports exercising daily  Family / Social history:  - FH: pertinent negatives include HTN, DM - Tobacco: Former smoker - Alcohol: 2 cans of beer daily in the afternoon  Home BP readings:  - Pt does not monitor BP at home  O:  L arm after 5 minutes rest: 144/84, HR 71  Last 3 Office BP readings: BP Readings from Last 3 Encounters:  03/29/18 (!) 157/98  02/18/18 131/85  02/05/18 (!) 144/101    BMET    Component Value Date/Time   NA 141 02/05/2018 1323   K 3.7 02/05/2018 1323   CL 106 02/05/2018 1323   CO2 26 02/05/2018 1323   GLUCOSE 142 (H) 02/05/2018 1323   BUN 12 02/05/2018 1323   CREATININE 0.75 02/05/2018 1323   CALCIUM 9.3 02/05/2018 1323   GFRNONAA >60 02/05/2018 1323   GFRAA >60 02/05/2018 1323    Renal function: CrCl cannot be calculated (Patient's most recent lab result is older than the maximum 21 days allowed.).  A/P: Blood pressure elevated without diagnosed HTN. Pt not on medications for BP. BP improved since last visit but remains elevated. Will have patient schedule follow-up with PCP. -Counseled on lifestyle modifications for blood pressure control including reduced dietary sodium, increased exercise, adequate sleep  Results reviewed and written information provided.  Total time in face-to-face counseling 15 minutes.   F/U Clinic Visit 05/19/18.  Butch Penny, PharmD, CPP Clinical  Pharmacist [facility] [phone]

## 2018-04-28 NOTE — Patient Instructions (Signed)
Gracias por venir a vernos hoy.   La presin sangunea hoy en da mejora pero an se eleva. Leanna Sato cita para ver a su PCP lo antes posible.   Limitar la sal y Geophysicist/field seismologist, as como hacer ejercicio durante al menos 30 minutos durante 5 das a la [place], tambin pueden ayudarlo a Publishing copy la presin arterial.   Tmese la presin arterial en casa si puede. Escriba estos nmeros y trigalos a sus visitas.   Si tiene [employer], llmeme al [phone].   Franky Macho

## 2018-05-19 ENCOUNTER — Encounter: Payer: Self-pay | Admitting: Nurse Practitioner

## 2018-05-19 ENCOUNTER — Ambulatory Visit: Payer: Self-pay | Attending: Nurse Practitioner | Admitting: Nurse Practitioner

## 2018-05-19 VITALS — BP 163/99 | HR 83 | Temp 99.0°F | Ht 63.0 in | Wt 131.8 lb

## 2018-05-19 DIAGNOSIS — R7303 Prediabetes: Secondary | ICD-10-CM | POA: Insufficient documentation

## 2018-05-19 DIAGNOSIS — F419 Anxiety disorder, unspecified: Secondary | ICD-10-CM | POA: Insufficient documentation

## 2018-05-19 DIAGNOSIS — I10 Essential (primary) hypertension: Secondary | ICD-10-CM | POA: Insufficient documentation

## 2018-05-19 DIAGNOSIS — Z79899 Other long term (current) drug therapy: Secondary | ICD-10-CM | POA: Insufficient documentation

## 2018-05-19 MED ORDER — AMLODIPINE BESYLATE 5 MG PO TABS: 5.0000 mg | ORAL_TABLET | Freq: Every day | ORAL | 3 refills | Status: DC

## 2018-05-19 NOTE — Progress Notes (Signed)
Assessment & Plan:  Stephen Oneal was seen today for follow-up.  Diagnoses and all orders for this visit:  Essential hypertension -     amLODipine (NORVASC) 5 MG tablet; Take 1 tablet (5 mg total) by mouth daily.    Patient has been counseled on age-appropriate routine health concerns for screening and prevention. These are reviewed and up-to-date. Referrals have been placed accordingly. Immunizations are up-to-date or declined.    Subjective:   Chief Complaint  Patient presents with  . Follow-up    Pt. is here for blood pressure.    HPI Stephen Oneal 42 y.o. male presents to office today for follow up to BP.   Essential Hypertension He denies any previous history of hypertension. Will start on amlodipine 5 mg today. He has been on a trial of dietary and exercise modification for the past month with no reduction in blood pressure. He currently denies chest pain, shortness of breath, palpitations, lightheadedness, dizziness, headaches or BLE edema.  BP Readings from Last 3 Encounters:  05/19/18 (!) 163/99  04/28/18 (!) 144/84  03/29/18 (!) 157/98   Anxiety He was prescribed Buspar last month for anxiety. Reports it makes him drowsy and he can not take this due to the type of job he has. He goes for a walk now when he feels anxious. States this has been a helpful coping mechanism for him. He declines any new anxiolytics today.  GAD 7 : Generalized Anxiety Score 05/19/2018 02/18/2018  Nervous, Anxious, on Edge 0 3  Control/stop worrying 0 3  Worry too much - different things 0 3  Trouble relaxing 0 3  Restless 0 3  Easily annoyed or irritable 0 2  Afraid - awful might happen 0 2  Total GAD 7 Score 0 19   Depression screen [facility] 2/9 05/19/2018 02/18/2018  Decreased Interest 0 2  Down, Depressed, Hopeless 0 1  PHQ - 2 Score 0 3  Altered sleeping 0 3  Tired, decreased energy 0 3  Change in appetite 0 3  Feeling bad or failure about yourself  0 0  Trouble concentrating 0  3  Moving slowly or fidgety/restless 0 0  Suicidal thoughts 0 0  PHQ-9 Score 0 15     Review of Systems  Constitutional: Negative for fever, malaise/fatigue and weight loss.  HENT: Negative.  Negative for nosebleeds.   Eyes: Negative.  Negative for blurred vision, double vision and photophobia.  Respiratory: Negative.  Negative for cough and shortness of breath.   Cardiovascular: Negative.  Negative for chest pain, palpitations and leg swelling.  Gastrointestinal: Negative.  Negative for heartburn, nausea and vomiting.  Musculoskeletal: Negative.  Negative for myalgias.  Neurological: Negative.  Negative for dizziness, focal weakness, seizures and headaches.  Psychiatric/Behavioral: Negative.  Negative for suicidal ideas.    Past Medical History:  Diagnosis Date  . Anxiety   . Prediabetes     History reviewed. No pertinent surgical history.  Family History  Problem Relation Age of Onset  . Diabetes Neg Hx   . Hypertension Neg Hx     Social History Reviewed with no changes to be made today.   Outpatient Medications Prior to Visit  Medication Sig Dispense Refill  . ibuprofen (ADVIL,MOTRIN) 200 MG tablet Take 200 mg by mouth every 6 (six) hours as needed (for pain).    . busPIRone (BUSPAR) 10 MG tablet Take 1 tablet (10 mg total) by mouth 2 (two) times daily. (Patient not taking: Reported on 05/19/2018) 60 tablet 3  .  ranitidine (ZANTAC) 150 MG capsule Take 1 capsule (150 mg total) by mouth daily. (Patient not taking: Reported on 05/19/2018) 90 capsule 0   No facility-administered medications prior to visit.     No Known Allergies     Objective:    BP (!) 163/99 (BP Location: Left Arm, Patient Position: Sitting, Cuff Size: Normal)   Pulse 83   Temp 99 F (37.2 C) (Oral)   Ht 5\' 3"  (1.6 m)   Wt 131 lb 12.8 oz (59.8 kg)   SpO2 97%   BMI 23.35 kg/m  Wt Readings from Last 3 Encounters:  05/19/18 131 lb 12.8 oz (59.8 kg)  03/29/18 133 lb 9.6 oz (60.6 kg)  02/18/18  126 lb (57.2 kg)    Physical Exam  Constitutional: He is oriented to person, place, and time. He appears well-developed and well-nourished. He is cooperative.  HENT:  Head: Normocephalic and atraumatic.  Eyes: EOM are normal.  Neck: Normal range of motion.  Cardiovascular: Normal rate, regular rhythm, normal heart sounds and intact distal pulses. Exam reveals no gallop and no friction rub.  No murmur heard. Pulmonary/Chest: Effort normal and breath sounds normal. No tachypnea. No respiratory distress. He has no decreased breath sounds. He has no wheezes. He has no rhonchi. He has no rales. He exhibits no tenderness.  Abdominal: Soft. Bowel sounds are normal.  Musculoskeletal: Normal range of motion. He exhibits no edema.  Neurological: He is alert and oriented to person, place, and time. Coordination normal.  Skin: Skin is warm and dry.  Psychiatric: He has a normal mood and affect. His behavior is normal. Judgment and thought content normal.  Nursing note and vitals reviewed.        Patient has been counseled extensively about nutrition and exercise as well as the importance of adherence with medications and regular follow-up. The patient was given clear instructions to go to ER or return to medical center if symptoms don't improve, worsen or new problems develop. The patient verbalized understanding.   Follow-up: Return for f/u 3-4 weeks for recheck Bp with [facility].   Claiborne Rigg, FNP-BC [facility] [place], [place] [phone]   05/19/2018, 4:13 PM

## 2018-06-16 ENCOUNTER — Ambulatory Visit: Payer: Self-pay | Attending: Nurse Practitioner | Admitting: Pharmacist

## 2018-06-16 ENCOUNTER — Encounter: Payer: Self-pay | Admitting: Pharmacist

## 2018-06-16 VITALS — BP 147/84 | HR 98

## 2018-06-16 DIAGNOSIS — Z79899 Other long term (current) drug therapy: Secondary | ICD-10-CM | POA: Insufficient documentation

## 2018-06-16 DIAGNOSIS — I10 Essential (primary) hypertension: Secondary | ICD-10-CM | POA: Insufficient documentation

## 2018-06-16 DIAGNOSIS — Z0130 Encounter for examination of blood pressure without abnormal findings: Secondary | ICD-10-CM | POA: Insufficient documentation

## 2018-06-16 NOTE — Patient Instructions (Signed)
Thank you for coming to see us today.   Blood pressure today is elevated so continue to take 1 pill of amlodipine every day.   Limiting salt and caffeine, as well as exercising as able for at least 30 minutes for 5 days out of the week, can also help you lower your blood pressure.  Take your blood pressure at home if you are able. Please write down these numbers and bring them to your visits.  If you have any questions about medications, please call me [phone].  Stephen Oneal

## 2018-06-16 NOTE — Progress Notes (Signed)
   S:  PCP: Bertram DenverZelda Fleming   Patient arrives in good spirits. Presents to the clinic for BP re-check. Patient was referred and last seen by Zelda on 05/19/18. BP was elevated 163/99. Amlodipine 5 mg started.   Patient denies CP, SOB, HA, or blurred vision. No dizziness or BLE edema.   Patient denies adherence. States that he lost his pill bottle for 2-3 weeks but found it Monday of this week. Has been taking daily since then.   Current hypertensive medications include:  - amlodipine 5 mg daily  Dietary habits include:  - Pt limits salt - Pt does not consume caffeine   Exercise habits include: - reports exercising daily   Family / Social history:  - FH: pertinent negatives include HTN, DM - Tobacco: Former smoker - Alcohol: 2 cans of beer daily in the afternoon  Home BP readings:  - Pt does not monitor BP at home  O:  L arm after 5 minutes rest: 147/87, HR 98  Last 3 Office BP readings: BP Readings from Last 3 Encounters:  05/19/18 (!) 163/99  04/28/18 (!) 144/84  03/29/18 (!) 157/98    BMET    Component Value Date/Time   NA 141 02/05/2018 1323   K 3.7 02/05/2018 1323   CL 106 02/05/2018 1323   CO2 26 02/05/2018 1323   GLUCOSE 142 (H) 02/05/2018 1323   BUN 12 02/05/2018 1323   CREATININE 0.75 02/05/2018 1323   CALCIUM 9.3 02/05/2018 1323   GFRNONAA >60 02/05/2018 1323   GFRAA >60 02/05/2018 1323    Renal function: CrCl cannot be calculated (Patient's most recent lab result is older than the maximum 21 days allowed.).  A/P: Hypertension uncontrolled on current medications. Pt only recently resumed his amlodipine. Will continue current dose and reevaluate in 2 weeks. -Continue amlodipine 5 mg -Counseled on lifestyle modifications for blood pressure control including reduced dietary sodium, increased exercise, adequate sleep  Results reviewed and written information provided.  Total time in face-to-face counseling 15 minutes.   F/U Clinic Visit in 2 weeks.    Butch PennyLuke Van Ausdall, PharmD, CPP Clinical Pharmacist [facility] [phone]

## 2018-06-29 ENCOUNTER — Ambulatory Visit: Payer: Self-pay | Admitting: Nurse Practitioner

## 2018-07-06 ENCOUNTER — Ambulatory Visit: Payer: Self-pay | Attending: Nurse Practitioner | Admitting: Pharmacist

## 2018-07-06 VITALS — BP 145/86 | HR 82

## 2018-07-06 DIAGNOSIS — I10 Essential (primary) hypertension: Secondary | ICD-10-CM | POA: Insufficient documentation

## 2018-07-06 DIAGNOSIS — Z79899 Other long term (current) drug therapy: Secondary | ICD-10-CM | POA: Insufficient documentation

## 2018-07-06 DIAGNOSIS — Z87891 Personal history of nicotine dependence: Secondary | ICD-10-CM | POA: Insufficient documentation

## 2018-07-06 MED ORDER — AMLODIPINE BESYLATE 10 MG PO TABS: 10.0000 mg | ORAL_TABLET | Freq: Every day | ORAL | 2 refills | Status: DC

## 2018-07-06 MED FILL — AMLODIPINE BESYLATE 10 MG T: 10 | 30 days supply | Qty: 30 | Fill #0

## 2018-07-06 NOTE — Progress Notes (Signed)
   S:  PCP: Zelda Fleming   Patient arrives in good spirits. Presents to the clinic for BP re-check. PaBertram Denvertient was referred and last seen by Zelda on 05/19/18. I last saw him on 06/16/18. No changes were made to his medications.   Patient denies CP, SOB, HA, or blurred vision. No dizziness or BLE edema.   Patient reports adherence.  Current hypertensive medications include:  - amlodipine 5 mg daily  Dietary habits include:  - Pt limits salt - Pt does not consume caffeine   Exercise habits include: - reports exercising daily   Family / Social history:  - FH: pertinent negatives include HTN, DM - Tobacco: Former smoker - Alcohol: 2 cans of beer daily in the afternoon  Home BP readings:  - Pt does not monitor BP at home  O:  L arm after 5 minutes rest: 147/87, HR 98   Last 3 Office BP readings: BP Readings from Last 3 Encounters:  06/16/18 (!) 147/84  05/19/18 (!) 163/99  04/28/18 (!) 144/84    BMET    Component Value Date/Time   NA 141 02/05/2018 1323   K 3.7 02/05/2018 1323   CL 106 02/05/2018 1323   CO2 26 02/05/2018 1323   GLUCOSE 142 (H) 02/05/2018 1323   BUN 12 02/05/2018 1323   CREATININE 0.75 02/05/2018 1323   CALCIUM 9.3 02/05/2018 1323   GFRNONAA >60 02/05/2018 1323   GFRAA >60 02/05/2018 1323    Renal function: CrCl cannot be calculated (Patient's most recent lab result is older than the maximum 21 days allowed.).  A/P: Hypertension uncontrolled on current medications. BP goal <130/80. Pt is adherent to his amlodipine.  -Increase amlodipine to 10 mg -Counseled on lifestyle modifications for blood pressure control including reduced dietary sodium, increased exercise, adequate sleep  Results reviewed and written information provided.  Total time in face-to-face counseling 15 minutes.   F/U Clinic Visit w/ PCP.   Butch PennyLuke Van Ausdall, PharmD, CPP Clinical Pharmacist [facility] [phone]

## 2018-07-06 NOTE — Patient Instructions (Signed)
Gracias por venir a vernos hoy.   La presin arterial hoy est elevada.   Estoy aumentando amlodipino a 10 mg. Tomar 1 pastilla CarMaxtodos los das.   Limitar la sal y Geophysicist/field seismologistla cafena, as como hacer ejercicio durante al menos 30 minutos durante 5 das a la [place], tambin pueden ayudarlo a Publishing copybajar la presin arterial.   Tmese la presin arterial en casa si puede. Escriba estos nmeros y trigalos a sus visitas.   Si tiene Mattelalguna pregunta sobre medicamentos, llmeme al [phone].   Franky MachoLuke    Thank you for coming to see us today.   Blood pressure today is elevated.  I am increasing amlodipine to 10 mg. Take 1 pill every day.  Limiting salt and caffeine, as well as exercising as able for at least 30 minutes for 5 days out of the week, can also help you lower your blood pressure.  Take your blood pressure at home if you are able. Please write down these numbers and bring them to your visits.  If you have any questions about medications, please call me [phone].  Franky MachoLuke

## 2018-08-24 MED FILL — AMLODIPINE BESYLATE 10 MG T: 10 | 30 days supply | Qty: 30 | Fill #1

## 2018-09-24 ENCOUNTER — Ambulatory Visit: Payer: Self-pay

## 2018-09-29 ENCOUNTER — Ambulatory Visit: Payer: Self-pay | Attending: Nurse Practitioner | Admitting: Nurse Practitioner

## 2018-09-29 ENCOUNTER — Encounter: Payer: Self-pay | Admitting: Nurse Practitioner

## 2018-09-29 VITALS — BP 145/86 | HR 83 | Temp 99.2°F | Ht 63.0 in | Wt 136.0 lb

## 2018-09-29 DIAGNOSIS — I10 Essential (primary) hypertension: Secondary | ICD-10-CM

## 2018-09-29 DIAGNOSIS — R7303 Prediabetes: Secondary | ICD-10-CM | POA: Insufficient documentation

## 2018-09-29 LAB — POCT GLYCOSYLATED HEMOGLOBIN (HGB A1C): Hemoglobin A1C: 5.8 % — AB (ref 4.0–5.6)

## 2018-09-29 LAB — GLUCOSE, POCT (MANUAL RESULT ENTRY): POC Glucose: 108 mg/dl — AB (ref 70–99)

## 2018-09-29 MED ORDER — AMLODIPINE BESYLATE 10 MG PO TABS: 10.0000 mg | ORAL_TABLET | Freq: Every day | ORAL | 2 refills | Status: DC

## 2018-09-29 MED ORDER — HYDROCHLOROTHIAZIDE 25 MG PO TABS: 25.0000 mg | ORAL_TABLET | Freq: Every day | ORAL | 3 refills | Status: DC

## 2018-09-29 MED FILL — HYDROCHLOROTHIAZIDE 25 MG T: 25 | 30 days supply | Qty: 30 | Fill #0

## 2018-09-29 NOTE — Progress Notes (Signed)
Assessment & Plan:  Stephen Oneal was seen today for follow-up.  Diagnoses and all orders for this visit:  Essential hypertension -     amLODipine (NORVASC) 10 MG tablet; Take 1 tablet (10 mg total) by mouth daily. -     hydrochlorothiazide (HYDRODIURIL) 25 MG tablet; Take 1 tablet (25 mg total) by mouth daily.  Prediabetes -     Glucose (CBG) -     HgB A1c -     Basic metabolic panel    Patient has been counseled on age-appropriate routine health concerns for screening and prevention. These are reviewed and up-to-date. Referrals have been placed accordingly. Immunizations are up-to-date or declined.    Subjective:   Chief Complaint  Patient presents with  . Follow-up    Pt. is here for blood pressure check.    HPI Stephen Oneal 43 y.o. male presents to office today for follow up to HTN.   Essential Hypertension Chronic. Currently taking amlodipine 10mg  daily as prescribed. Blood pressure not at goal. Will add HCTZ 25mg  daily and have him follow up for blood pressure recheck in a few weeks. He denies chest pain, shortness of breath, palpitations, lightheadedness, dizziness, headaches or BLE edema.  BP Readings from Last 3 Encounters:  09/29/18 (!) 145/86  07/06/18 (!) 145/86  06/16/18 (!) 147/84     Prediabetes Diet controlled. He also is very active at work and weight is stable. He denies any hypo or hyperglycemic symptoms.  Lab Results  Component Value Date   HGBA1C 5.8 (A) 09/29/2018    Review of Systems  Constitutional: Negative for fever, malaise/fatigue and weight loss.  HENT: Negative.  Negative for nosebleeds.   Eyes: Negative.  Negative for blurred vision, double vision and photophobia.  Respiratory: Negative.  Negative for cough and shortness of breath.   Cardiovascular: Negative.  Negative for chest pain, palpitations and leg swelling.  Gastrointestinal: Negative.  Negative for heartburn, nausea and vomiting.  Musculoskeletal: Negative.  Negative  for myalgias.  Neurological: Negative.  Negative for dizziness, focal weakness, seizures and headaches.  Psychiatric/Behavioral: Negative.  Negative for suicidal ideas.    Past Medical History:  Diagnosis Date  . Anxiety   . Prediabetes     History reviewed. No pertinent surgical history.  Family History  Problem Relation Age of Onset  . Diabetes Neg Hx   . Hypertension Neg Hx     Social History Reviewed with no changes to be made today.   Outpatient Medications Prior to Visit  Medication Sig Dispense Refill  . amLODipine (NORVASC) 10 MG tablet Take 1 tablet (10 mg total) by mouth daily. 30 tablet 2  . ibuprofen (ADVIL,MOTRIN) 200 MG tablet Take 200 mg by mouth every 6 (six) hours as needed (for pain).     No facility-administered medications prior to visit.     No Known Allergies     Objective:    BP (!) 145/86 (BP Location: Left Arm, Patient Position: Sitting, Cuff Size: Normal)   Pulse 83   Temp 99.2 F (37.3 C) (Oral)   Ht 5\' 3"  (1.6 m)   Wt 136 lb (61.7 kg)   SpO2 97%   BMI 24.09 kg/m  Wt Readings from Last 3 Encounters:  09/29/18 136 lb (61.7 kg)  05/19/18 131 lb 12.8 oz (59.8 kg)  03/29/18 133 lb 9.6 oz (60.6 kg)    Physical Exam Vitals signs and nursing note reviewed.  Constitutional:      Appearance: He is well-developed.  HENT:  Head: Normocephalic and atraumatic.  Neck:     Musculoskeletal: Normal range of motion.  Cardiovascular:     Rate and Rhythm: Normal rate and regular rhythm.     Heart sounds: Normal heart sounds. No murmur. No friction rub. No gallop.   Pulmonary:     Effort: Pulmonary effort is normal. No tachypnea or respiratory distress.     Breath sounds: Normal breath sounds. No decreased breath sounds, wheezing, rhonchi or rales.  Chest:     Chest wall: No tenderness.  Abdominal:     General: Bowel sounds are normal.     Palpations: Abdomen is soft.  Musculoskeletal: Normal range of motion.  Skin:    General: Skin is  warm and dry.  Neurological:     Mental Status: He is alert and oriented to person, place, and time.     Coordination: Coordination normal.  Psychiatric:        Behavior: Behavior normal. Behavior is cooperative.        Thought Content: Thought content normal.        Judgment: Judgment normal.          Patient has been counseled extensively about nutrition and exercise as well as the importance of adherence with medications and regular follow-up. The patient was given clear instructions to go to ER or return to medical center if symptoms don't improve, worsen or new problems develop. The patient verbalized understanding.   Follow-up: Return in about 3 weeks (around 10/20/2018) for HTN BP RECHECK WITH LUKE AND BMP 3 weeks.   Claiborne Rigg, FNP-BC [facility] and [facility] [place], [place] [phone]   09/29/2018, 6:18 PM

## 2018-10-18 MED FILL — AMLODIPINE BESYLATE 10 MG T: 10 | 30 days supply | Qty: 30 | Fill #2

## 2018-10-20 ENCOUNTER — Ambulatory Visit: Payer: Self-pay | Admitting: Pharmacist

## 2018-10-21 ENCOUNTER — Other Ambulatory Visit: Payer: Self-pay

## 2018-10-21 ENCOUNTER — Ambulatory Visit: Payer: Self-pay | Attending: Family Medicine | Admitting: Pharmacist

## 2018-10-21 ENCOUNTER — Encounter: Payer: Self-pay | Admitting: Pharmacist

## 2018-10-21 VITALS — BP 127/79 | HR 84

## 2018-10-21 DIAGNOSIS — I10 Essential (primary) hypertension: Secondary | ICD-10-CM

## 2018-10-21 NOTE — Patient Instructions (Signed)
Gracias por venir a vernos hoy.   La presin arterial hoy est bien controlada.   Contine tomando medicamentos para la presin arterial segn lo prescrito.   Limitar la sal y Geophysicist/field seismologist, as como hacer ejercicio durante al menos 30 minutos durante 5 das a la [place], tambin pueden ayudarlo a Publishing copy la presin arterial.   Tmese la presin arterial en casa si puede. Escriba estos nmeros y trigalos a sus visitas. Si tiene [employer], llmeme al [phone].   Franky Macho   English translation: Thank you for coming to see [place] today.   Blood pressure today is well-controlled.   Continue taking blood pressure medications as prescribed.   Limiting salt and caffeine, as well as exercising as able for at least 30 minutes for 5 days out of the week, can also help you lower your blood pressure.  Take your blood pressure at home if you are able. Please write down these numbers and bring them to your visits.  If you have any questions about medications, please call me [phone].  Franky Macho

## 2018-10-21 NOTE — Progress Notes (Signed)
   S:    PCP: Zelda   Patient arrives in good spirits. Presents to the clinic for hypertension management. Patient was referred by Zelda on 09/29/18. BP 145/86 - HCTZ added to regimen. Pt told to avoid NSAIDs.   Patient reports adherence with medications.  Denies chest pain, dyspnea, HA or blurred vision. Denies BLE edema.   Current BP Medications include:  Amlodipine 10 mg daily, HCTZ 25 mg daily  Dietary habits include: does not limit salt, denies drinking caffeine  Exercise habits include: does not exercise outside of work Family / Social history:  - FHx: pertinent negatives include DM, HTN - Never smoker - Drinks 1-2 beers/day  Home BP readings: not checking  O:  L arm after 5 minutes rest: 127/79, HR 84 Last 3 Office BP readings: BP Readings from Last 3 Encounters:  09/29/18 (!) 145/86  07/06/18 (!) 145/86  06/16/18 (!) 147/84   BMET    Component Value Date/Time   NA 141 02/05/2018 1323   K 3.7 02/05/2018 1323   CL 106 02/05/2018 1323   CO2 26 02/05/2018 1323   GLUCOSE 142 (H) 02/05/2018 1323   BUN 12 02/05/2018 1323   CREATININE 0.75 02/05/2018 1323   CALCIUM 9.3 02/05/2018 1323   GFRNONAA >60 02/05/2018 1323   GFRAA >60 02/05/2018 1323   Renal function: CrCl cannot be calculated (Patient's most recent lab result is older than the maximum 21 days allowed.).  Clinical ASCVD: No  The ASCVD Risk score Denman George DC Jr., et al., 2013) failed to calculate for the following reasons:   Cannot find a previous HDL lab   Cannot find a previous total cholesterol lab  A/P: Hypertension longstanding currently controlled on current medications. BP Goal <130/80 mmHg. Patient is adherent with current medications. Commended him for this and good BP control. He knows to continue taking medications every day; he knows to call the pharmacy for refills.  -Continued current regimen.  -F/u labs ordered - BMP, lipid -Counseled on lifestyle modifications for blood pressure control  including reduced dietary sodium, increased exercise, adequate sleep  Results reviewed and written information provided. Total time in face-to-face counseling 15 minutes.   F/U Clinic Visit in May with PCP.    Butch Penny, PharmD, CPP Clinical Pharmacist [facility] & [facility] [phone]

## 2018-10-22 LAB — BASIC METABOLIC PANEL
BUN/Creatinine Ratio: 16 (ref 9–20)
BUN: 11 mg/dL (ref 6–24)
CALCIUM: 9.9 mg/dL (ref 8.7–10.2)
CHLORIDE: 94 mmol/L — AB (ref 96–106)
CO2: 25 mmol/L (ref 20–29)
Creatinine, Ser: 0.69 mg/dL — ABNORMAL LOW (ref 0.76–1.27)
GFR, EST AFRICAN AMERICAN: 133 mL/min/{1.73_m2} (ref >59)
GFR, EST NON AFRICAN AMERICAN: 115 mL/min/{1.73_m2} (ref >59)
Glucose: 105 mg/dL — ABNORMAL HIGH (ref 65–99)
POTASSIUM: 3.3 mmol/L — AB (ref 3.5–5.2)
Sodium: 139 mmol/L (ref 134–144)

## 2018-10-22 LAB — LIPID PANEL
Chol/HDL Ratio: 4.1 ratio (ref 0.0–5.0)
Cholesterol, Total: 211 mg/dL — ABNORMAL HIGH (ref 100–199)
HDL: 51 mg/dL (ref >39)
LDL Calculated: 129 mg/dL — ABNORMAL HIGH (ref 0–99)
TRIGLYCERIDES: 154 mg/dL — AB (ref 0–149)
VLDL Cholesterol Cal: 31 mg/dL (ref 5–40)

## 2018-10-25 ENCOUNTER — Other Ambulatory Visit: Payer: Self-pay | Admitting: Nurse Practitioner

## 2018-10-25 ENCOUNTER — Telehealth: Payer: Self-pay

## 2018-10-25 DIAGNOSIS — I10 Essential (primary) hypertension: Secondary | ICD-10-CM

## 2018-10-25 MED ORDER — LISINOPRIL 10 MG PO TABS: 10.0000 mg | ORAL_TABLET | Freq: Every day | ORAL | 3 refills | Status: DC

## 2018-10-25 NOTE — Telephone Encounter (Signed)
CMA spoke to patient to inform on PCP advising to stop taking the Hydrochlorothiazide 25 and to continue taking Amlodipine 10mg . Pt. Is aware of new medication he need to picks up and will stop by the front desk to schedule a BP appt. And lab appt.   A spanish interpreter assist with the call.

## 2018-10-25 NOTE — Telephone Encounter (Signed)
-----   Message from Claiborne Rigg, NP sent at 10/25/2018  3:29 PM EDT ----- Potassium which is been electrolyte that can also affect the electrical channels of the heart is slightly  below normal.  At this time I would like for you to stop taking hydrochlorothiazide 25 mg daily.  Continue your amlodipine 10 mg daily and I have sent in a new blood pressure medication called lisinopril which I would like for you to begin taking.  Please make an office appointment with the pharmacist to have your blood pressure check 3 weeks after you have started the new blood pressure medication.  You also need additional lab work at this time.  Please make a lab appointment on the same day that you see the pharmacist.

## 2018-10-26 MED FILL — LISINOPRIL 10 MG TABS: 10 | 90 days supply | Qty: 90 | Fill #0

## 2018-11-16 ENCOUNTER — Ambulatory Visit: Payer: Self-pay | Attending: Nurse Practitioner | Admitting: Pharmacist

## 2018-11-16 ENCOUNTER — Other Ambulatory Visit: Payer: Self-pay

## 2018-11-16 ENCOUNTER — Encounter: Payer: Self-pay | Admitting: Pharmacist

## 2018-11-16 VITALS — BP 135/78 | HR 76

## 2018-11-16 DIAGNOSIS — I10 Essential (primary) hypertension: Secondary | ICD-10-CM

## 2018-11-16 NOTE — Patient Instructions (Signed)
Thank you for coming to see us today.   Blood pressure today looks good.  Continue taking blood pressure medications as prescribed.   Limiting salt and caffeine, as well as exercising as able for at least 30 minutes for 5 days out of the week, can also help you lower your blood pressure.  Take your blood pressure at home if you are able. Please write down these numbers and bring them to your visits.  If you have any questions about medications, please call me [phone].  Stephen Oneal  

## 2018-11-16 NOTE — Progress Notes (Signed)
   S:    PCP: Zelda   Patient arrives in good spirits. Presents to the clinic for hypertension management. Patient was referred by Zelda on 10/25/18 - pt had labs resulted (3/19) that were significant for hypokalemia. Zelda stopped HCTZ and started lisinopril 10 mg daily. Amlodipine 10 mg daily was continued.   Patient reports adherence with medications.  Denies chest pain, dyspnea, HA or blurred vision. Denies BLE edema.   Current BP Medications include:  Amlodipine 10 mg daily, lisinopril 10 mg daily  Dietary habits include: does not limit salt, denies drinking caffeine  Exercise habits include: does not exercise outside of work Family / Social history:  - FHx: pertinent negatives include DM, HTN - Never smoker - Drinks 1-2 beers/day  Home BP readings: not checking  O:  L arm after 5 minutes rest: 135/78, HR 76  Last 3 Office BP readings: BP Readings from Last 3 Encounters:  11/16/18 135/78  10/21/18 127/79  09/29/18 (!) 145/86   BMET    Component Value Date/Time   NA 139 10/21/2018 1648   K 3.3 (L) 10/21/2018 1648   CL 94 (L) 10/21/2018 1648   CO2 25 10/21/2018 1648   GLUCOSE 105 (H) 10/21/2018 1648   GLUCOSE 142 (H) 02/05/2018 1323   BUN 11 10/21/2018 1648   CREATININE 0.69 (L) 10/21/2018 1648   CALCIUM 9.9 10/21/2018 1648   GFRNONAA 115 10/21/2018 1648   GFRAA 133 10/21/2018 1648   Renal function: CrCl cannot be calculated (Patient's most recent lab result is older than the maximum 21 days allowed.).  Clinical ASCVD: No  The 10-year ASCVD risk score Denman George DC Jr., et al., 2013) is: 2.8%   Values used to calculate the score:     Age: 43 years     Sex: Male     Is Non-Hispanic African American: No     Diabetic: No     Tobacco smoker: No     Systolic Blood Pressure: 135 mmHg     Is BP treated: Yes     HDL Cholesterol: 51 mg/dL     Total Cholesterol: 211 mg/dL  A/P: Hypertension longstanding currently controlled on current medications. His SBP is slightly  above BP Goal <130/80 mmHg. Patient is adherent with current medications. Will continue current regimen for now.  -Continued current regimen.  -F/u labs ordered - BMP -Counseled on lifestyle modifications for blood pressure control including reduced dietary sodium, increased exercise, adequate sleep  Results reviewed and written information provided. Total time in face-to-face counseling 15 minutes.   F/U Clinic Visit with PCP as needed.     Butch Penny, PharmD, CPP Clinical Pharmacist [facility] & [facility] [phone]

## 2018-11-17 LAB — BASIC METABOLIC PANEL
BUN/Creatinine Ratio: 23 — ABNORMAL HIGH (ref 9–20)
BUN: 15 mg/dL (ref 6–24)
CO2: 25 mmol/L (ref 20–29)
Calcium: 10 mg/dL (ref 8.7–10.2)
Chloride: 97 mmol/L (ref 96–106)
Creatinine, Ser: 0.66 mg/dL — ABNORMAL LOW (ref 0.76–1.27)
GFR calc Af Amer: 135 mL/min/{1.73_m2} (ref >59)
GFR calc non Af Amer: 117 mL/min/{1.73_m2} (ref >59)
Glucose: 91 mg/dL (ref 65–99)
Potassium: 3.8 mmol/L (ref 3.5–5.2)
Sodium: 140 mmol/L (ref 134–144)

## 2018-11-26 ENCOUNTER — Telehealth: Payer: Self-pay

## 2018-11-26 NOTE — Telephone Encounter (Signed)
CMA attempt to reach patient to inform on lab results.  No answer and left a VM for a call back.  

## 2018-11-26 NOTE — Telephone Encounter (Signed)
-----   Message from Claiborne Rigg, NP sent at 11/22/2018 10:22 AM EDT ----- Labs do not show any kidney or liver dysfunction. Other electrolytes including sodium and potassium are normal.

## 2018-11-30 ENCOUNTER — Telehealth: Payer: Self-pay | Admitting: Nurse Practitioner

## 2018-11-30 NOTE — Telephone Encounter (Signed)
Patient wife called back in regards to labs/missed call please follow up.

## 2018-12-01 MED FILL — ?AMLODIPINE BESYLATE 10 MG: 10 | 30 days supply | Qty: 30 | Fill #0

## 2018-12-01 NOTE — Telephone Encounter (Signed)
CMA spoke to patient's wife who is on the [facility] and was inform on patient lab results.

## 2019-01-11 MED FILL — ?AMLODIPINE BESYLATE 10 MG: 10 | 30 days supply | Qty: 30 | Fill #1

## 2019-02-23 MED FILL — LISINOPRIL 10 MG TABS: 10 | 90 days supply | Qty: 90 | Fill #1

## 2019-02-23 MED FILL — ?AMLODIPINE BESYLATE 10 MG: 10 | 30 days supply | Qty: 30 | Fill #2

## 2019-03-07 ENCOUNTER — Emergency Department (HOSPITAL_COMMUNITY): Payer: Self-pay

## 2019-03-07 ENCOUNTER — Emergency Department (HOSPITAL_COMMUNITY)
Admission: EM | Admit: 2019-03-07 | Discharge: 2019-03-07 | Disposition: A | Discharge status: Home / Self Care | Payer: Self-pay | Attending: Emergency Medicine | Admitting: Emergency Medicine

## 2019-03-07 ENCOUNTER — Other Ambulatory Visit: Payer: Self-pay

## 2019-03-07 ENCOUNTER — Encounter (HOSPITAL_COMMUNITY): Payer: Self-pay | Admitting: Emergency Medicine

## 2019-03-07 DIAGNOSIS — K2970 Gastritis, unspecified, without bleeding: Secondary | ICD-10-CM | POA: Insufficient documentation

## 2019-03-07 DIAGNOSIS — I10 Essential (primary) hypertension: Secondary | ICD-10-CM | POA: Insufficient documentation

## 2019-03-07 DIAGNOSIS — R1013 Epigastric pain: Secondary | ICD-10-CM | POA: Insufficient documentation

## 2019-03-07 DIAGNOSIS — Z87891 Personal history of nicotine dependence: Secondary | ICD-10-CM | POA: Insufficient documentation

## 2019-03-07 DIAGNOSIS — Z79899 Other long term (current) drug therapy: Secondary | ICD-10-CM | POA: Insufficient documentation

## 2019-03-07 HISTORY — DX: Essential (primary) hypertension: I10

## 2019-03-07 LAB — BASIC METABOLIC PANEL
Anion gap: 13 (ref 5–15)
BUN: 7 mg/dL (ref 6–20)
CO2: 26 mmol/L (ref 22–32)
Calcium: 9.5 mg/dL (ref 8.9–10.3)
Chloride: 100 mmol/L (ref 98–111)
Creatinine, Ser: 0.66 mg/dL (ref 0.61–1.24)
GFR calc Af Amer: >60 mL/min (ref >60)
GFR calc non Af Amer: >60 mL/min (ref >60)
Glucose, Bld: 119 mg/dL — ABNORMAL HIGH (ref 70–99)
Potassium: 3.5 mmol/L (ref 3.5–5.1)
Sodium: 139 mmol/L (ref 135–145)

## 2019-03-07 LAB — CBC
HCT: 42.7 % (ref 39.0–52.0)
Hemoglobin: 15.1 g/dL (ref 13.0–17.0)
MCH: 31.6 pg (ref 26.0–34.0)
MCHC: 35.4 g/dL (ref 30.0–36.0)
MCV: 89.3 fL (ref 80.0–100.0)
Platelets: 251 10*3/uL (ref 150–400)
RBC: 4.78 MIL/uL (ref 4.22–5.81)
RDW: 12.1 % (ref 11.5–15.5)
WBC: 9.7 10*3/uL (ref 4.0–10.5)
nRBC: 0 % (ref 0.0–0.2)

## 2019-03-07 LAB — TROPONIN I (HIGH SENSITIVITY): Troponin I (High Sensitivity): 10 ng/L (ref <18)

## 2019-03-07 MED ORDER — ASPIRIN 81 MG PO CHEW
324.0000 mg | CHEWABLE_TABLET | Freq: Once | ORAL | Status: AC
  Administered 2019-03-07: 15:00:00 324 mg via ORAL
  Filled 2019-03-07: qty 4

## 2019-03-07 MED ORDER — ALUM & MAG HYDROXIDE-SIMETH 200-200-20 MG/5ML PO SUSP
30.0000 mL | Freq: Once | ORAL | Status: AC
  Administered 2019-03-07: 30 mL via ORAL
  Filled 2019-03-07: qty 30

## 2019-03-07 MED ORDER — LIDOCAINE VISCOUS HCL 2 % MT SOLN
15.0000 mL | Freq: Once | OROMUCOSAL | Status: AC
  Administered 2019-03-07: 15:00:00 15 mL via ORAL
  Filled 2019-03-07: qty 15

## 2019-03-07 MED ORDER — PANTOPRAZOLE SODIUM 20 MG PO TBEC: 20.0000 mg | DELAYED_RELEASE_TABLET | Freq: Every day | ORAL | 0 refills | Status: DC

## 2019-03-07 MED FILL — PANTOPRAZOLE SOD DR 20 MG T: 20 | 30 days supply | Qty: 30 | Fill #0

## 2019-03-07 NOTE — ED Notes (Signed)
ED Provider at bedside. 

## 2019-03-07 NOTE — ED Notes (Signed)
Interpreter: I came before and I was told I have high blood pressure 1 year ago. I take bp medications. I took 1 pill this morning. Lisinopril and Amlodipine at bedside. I am having chest pain in the center of my chest today since this morning. The pain was sharp and I do not have any shortness of breath, but I have been having some cramping in my arm and legs.    EDP at bedside: Pain with palpation of chest and epigastric area.

## 2019-03-07 NOTE — ED Triage Notes (Signed)
All over Cp x 4 days denies sob  No vomiting

## 2019-03-07 NOTE — ED Provider Notes (Signed)
[facility] EMERGENCY DEPARTMENT Provider Note   CSN: [id number] Arrival date & time: 03/07/19  1339    History   Chief Complaint Chief Complaint  Patient presents with   Chest Pain    HPI Stephen Oneal is a 43 y.o. male presenting to the ED complaining of midline lower chest pain/epigastric pain over the past 2 to 3 days that has worsened since 9 AM this morning.  Patient reports his pain is sharp and moderate to severe.  He denies any associated shortness of breath, cough, nausea, vomiting, palpitations, or diaphoresis.  Patient reports he has had similar pain in the past that improved on its own but states this episode of pain is not going away so he came to the ED for evaluation.     The history is provided by the patient. A language interpreter was used.    Past Medical History:  Diagnosis Date   Anxiety    Hypertension    Prediabetes     Patient Active Problem List   Diagnosis Date Noted   Essential hypertension 09/29/2018   Prediabetes 09/29/2018    History reviewed. No pertinent surgical history.      Home Medications    Prior to Admission medications   Medication Sig Start Date End Date Taking? Authorizing Provider  amLODipine (NORVASC) 10 MG tablet Take 1 tablet (10 mg total) by mouth daily. 09/29/18 12/28/18  Gildardo Pounds, NP  lisinopril (PRINIVIL,ZESTRIL) 10 MG tablet Take 1 tablet (10 mg total) by mouth daily. 10/25/18   Gildardo Pounds, NP  pantoprazole (PROTONIX) 20 MG tablet Take 1 tablet (20 mg total) by mouth daily. 03/07/19 04/06/19  Candie Chroman, MD    Family History Family History  Problem Relation Age of Onset   Diabetes Neg Hx    Hypertension Neg Hx     Social History Social History   Tobacco Use   Smoking status: Former Smoker   Smokeless tobacco: Never Used  Substance Use Topics   Alcohol use: Yes    Alcohol/week: 2.0 standard drinks    Types: 2 Cans of beer per week    Comment: daily    Drug use: No     Allergies   Fish allergy and Shrimp [shellfish allergy]   Review of Systems Review of Systems  Constitutional: Negative for chills and fever.  HENT: Negative for ear pain and sore throat.   Eyes: Negative for pain and visual disturbance.  Respiratory: Negative for cough and shortness of breath.   Cardiovascular: Positive for chest pain. Negative for palpitations.  Gastrointestinal: Negative for abdominal pain and vomiting.  Genitourinary: Negative for dysuria and hematuria.  Musculoskeletal: Negative for arthralgias and back pain.  Skin: Negative for color change and rash.  Neurological: Negative for seizures and syncope.  All other systems reviewed and are negative.    Physical Exam Updated Vital Signs BP (!) 143/97    Pulse 83    Temp 99.2 F (37.3 C) (Oral)    Resp (!) 22    SpO2 98%   Physical Exam Vitals signs and nursing note reviewed.  Constitutional:      General: He is not in acute distress.    Appearance: Normal appearance. He is well-developed and normal weight. He is not ill-appearing, toxic-appearing or diaphoretic.  HENT:     Head: Normocephalic and atraumatic.     Nose: Nose normal. No congestion or rhinorrhea.     Mouth/Throat:     Mouth: Mucous membranes are moist.  Pharynx: Oropharynx is clear. No oropharyngeal exudate or posterior oropharyngeal erythema.  Eyes:     Extraocular Movements: Extraocular movements intact.     Conjunctiva/sclera: Conjunctivae normal.     Pupils: Pupils are equal, round, and reactive to light.  Neck:     Musculoskeletal: Normal range of motion and neck supple. No neck rigidity or muscular tenderness.  Cardiovascular:     Rate and Rhythm: Normal rate and regular rhythm.     Pulses: Normal pulses.     Heart sounds: Normal heart sounds. No murmur. No friction rub. No gallop.   Pulmonary:     Effort: Pulmonary effort is normal. No respiratory distress.     Breath sounds: Normal breath sounds. No stridor.  No wheezing, rhonchi or rales.  Chest:     Chest wall: Tenderness (to the lower chest) present.  Abdominal:     General: Abdomen is flat. There is no distension.     Palpations: Abdomen is soft.     Tenderness: There is abdominal tenderness (to the epigastrium). There is no guarding or rebound.  Musculoskeletal: Normal range of motion.        General: No swelling, tenderness, deformity or signs of injury.  Skin:    General: Skin is warm and dry.  Neurological:     General: No focal deficit present.     Mental Status: He is alert and oriented to person, place, and time. Mental status is at baseline.     Cranial Nerves: No cranial nerve deficit.     Sensory: No sensory deficit.     Motor: No weakness.  Psychiatric:        Mood and Affect: Mood normal.        Behavior: Behavior normal.      ED Treatments / Results  Labs (all labs ordered are listed, but only abnormal results are displayed) Labs Reviewed  BASIC METABOLIC PANEL - Abnormal; Notable for the following components:      Result Value   Glucose, Bld 119 (*)    All other components within normal limits  CBC  TROPONIN I (HIGH SENSITIVITY)    EKG EKG Interpretation  Date/Time:  Monday March 07 2019 13:52:49 EDT Ventricular Rate:  80 PR Interval:    QRS Duration: 80 QT Interval:  367 QTC Calculation: 424 R Axis:   3 Text Interpretation:  Sinus rhythm Sinus arrhythmia no other findings since last tracing no significant change Confirmed by Eber HongMiller, Brian ([id number]) on 03/07/2019 2:26:10 PM   Radiology Dg Chest Portable 1 View  Result Date: 03/07/2019 CLINICAL DATA:  Chest pain EXAM: PORTABLE CHEST 1 VIEW COMPARISON:  February 05, 2018 FINDINGS: No edema or consolidation. Heart is mildly enlarged with pulmonary vascularity normal. No adenopathy. No pneumothorax. No bone lesions. IMPRESSION: No edema or consolidation.  Mild cardiac prominence. Electronically Signed   By: Bretta BangWilliam  Woodruff III M.D.   On: 03/07/2019 14:50     Procedures Procedures (including critical care time)  Medications Ordered in ED Medications  aspirin chewable tablet 324 mg (324 mg Oral Given 03/07/19 1444)  alum & mag hydroxide-simeth (MAALOX/MYLANTA) 200-200-20 MG/5ML suspension 30 mL (30 mLs Oral Given 03/07/19 1443)    And  lidocaine (XYLOCAINE) 2 % viscous mouth solution 15 mL (15 mLs Oral Given 03/07/19 1443)     Initial Impression / Assessment and Plan / ED Course  I have reviewed the triage vital signs and the nursing notes.  Pertinent labs & imaging results that were available during my care  of the patient were reviewed by me and considered in my medical decision making (see chart for details).        Antonieta Perticanor Santos-Martinez is a 43 y.o. male presenting to the ED complaining of midline lower chest pain/epigastric pain over the past 2 to 3 days that has worsened since 9 AM this morning.    EKG was reviewed and shows normal sinus rhythm with normal axis, normal intervals, and no acute ischemic changes.  Chest x-ray was independently reviewed by myself and radiology which showed no evidence of acute cardiopulmonary abnormalities.  CBC and BMP were unremarkable.  Troponin negative.  Patient was given a GI cocktail with some improvement of his symptoms.  Upon further questioning, patient reports having gastritis in the past for which she was given medication by his PCP which he did not take.  Patient was given a prescription for pantoprazole and instructions to follow-up closely with his primary care physician.   Final Clinical Impressions(s) / ED Diagnoses   Final diagnoses:  Gastritis without bleeding, unspecified chronicity, unspecified gastritis type    ED Discharge Orders         Ordered    pantoprazole (PROTONIX) 20 MG tablet  Daily     03/07/19 1534           Garry HeaterEames, Domenik Trice, MD 03/07/19 1539    Eber HongMiller, Brian, MD 03/08/19 2025

## 2019-04-19 MED FILL — LISINOPRIL 10 MG TABS: 10 | 90 days supply | Qty: 90 | Fill #2

## 2019-04-19 MED FILL — ?AMLODIPINE BESYLATE 10 MG: 10 | 30 days supply | Qty: 30 | Fill #3

## 2019-05-04 ENCOUNTER — Ambulatory Visit: Payer: Self-pay | Attending: Family Medicine | Admitting: Physician Assistant

## 2019-05-04 ENCOUNTER — Other Ambulatory Visit: Payer: Self-pay

## 2019-05-04 DIAGNOSIS — Z789 Other specified health status: Secondary | ICD-10-CM

## 2019-05-04 DIAGNOSIS — K219 Gastro-esophageal reflux disease without esophagitis: Secondary | ICD-10-CM

## 2019-05-04 DIAGNOSIS — I10 Essential (primary) hypertension: Secondary | ICD-10-CM

## 2019-05-04 MED ORDER — PANTOPRAZOLE SODIUM 20 MG PO TBEC: 20.0000 mg | DELAYED_RELEASE_TABLET | Freq: Every day | ORAL | 0 refills | Status: DC

## 2019-05-04 MED ORDER — AMLODIPINE BESYLATE 10 MG PO TABS: 10.0000 mg | ORAL_TABLET | Freq: Every day | ORAL | 2 refills | Status: DC

## 2019-05-04 MED ORDER — LISINOPRIL 10 MG PO TABS: 10.0000 mg | ORAL_TABLET | Freq: Every day | ORAL | 3 refills | Status: DC

## 2019-05-04 MED FILL — PANTOPRAZOLE SOD DR 20 MG T: 20 | 30 days supply | Qty: 30 | Fill #0

## 2019-05-04 NOTE — Progress Notes (Signed)
Patient ID: Stephen Oneal, male   DOB: October 28, 1975, 43 y.o.   MRN: [id number] Virtual Visit via Telephone Note  I connected with Stephen Oneal on 05/04/19 at 10:10 AM EDT by telephone and verified that I am speaking with the correct person using two identifiers.   I discussed the limitations, risks, security and privacy concerns of performing an evaluation and management service by telephone and the availability of in person appointments. I also discussed with the patient that there may be a patient responsible charge related to this service. The patient expressed understanding and agreed to proceed.   Patient location:  home My Location: home office Persons on the call:  Me and the patient and interpreter  History of Present Illness:  Needs med RF.  No concerns or complaints.  UTD on labs.  No HA/CP/dizziness.    Observations/Objective:  NAD   Assessment and Plan: 1. Essential hypertension - amLODipine (NORVASC) 10 MG tablet; Take 1 tablet (10 mg total) by mouth daily.  Dispense: 90 tablet; Refill: 2 - lisinopril (ZESTRIL) 10 MG tablet; Take 1 tablet (10 mg total) by mouth daily.  Dispense: 90 tablet; Refill: 3  2. Language barrier stratus interpreters used and additional time performing visit was required.   3. Gastroesophageal reflux disease, esophagitis presence not specified - pantoprazole (PROTONIX) 20 MG tablet; Take 1 tablet (20 mg total) by mouth daily.  Dispense: 30 tablet; Refill: 0   Follow Up Instructions: 6 months pcp   I discussed the assessment and treatment plan with the patient. The patient was provided an opportunity to ask questions and all were answered. The patient agreed with the plan and demonstrated an understanding of the instructions.   The patient was advised to call back or seek an in-person evaluation if the symptoms worsen or if the condition fails to improve as anticipated.  I provided 9 minutes of non-face-to-face time during  this encounter.   Freeman Caldron, PA-C

## 2019-05-25 ENCOUNTER — Encounter: Payer: Self-pay | Admitting: Family Medicine

## 2019-05-25 ENCOUNTER — Ambulatory Visit: Payer: Self-pay | Attending: Family Medicine | Admitting: Family Medicine

## 2019-05-25 ENCOUNTER — Other Ambulatory Visit: Payer: Self-pay

## 2019-05-25 DIAGNOSIS — K219 Gastro-esophageal reflux disease without esophagitis: Secondary | ICD-10-CM

## 2019-05-25 DIAGNOSIS — R7303 Prediabetes: Secondary | ICD-10-CM

## 2019-05-25 DIAGNOSIS — Z603 Acculturation difficulty: Secondary | ICD-10-CM

## 2019-05-25 DIAGNOSIS — Z789 Other specified health status: Secondary | ICD-10-CM

## 2019-05-25 DIAGNOSIS — Z758 Other problems related to medical facilities and other health care: Secondary | ICD-10-CM

## 2019-05-25 DIAGNOSIS — I10 Essential (primary) hypertension: Secondary | ICD-10-CM

## 2019-05-25 DIAGNOSIS — Z09 Encounter for follow-up examination after completed treatment for conditions other than malignant neoplasm: Secondary | ICD-10-CM

## 2019-05-25 MED ORDER — AMLODIPINE BESYLATE 10 MG PO TABS: 10.0000 mg | ORAL_TABLET | Freq: Every day | ORAL | 1 refills | Status: DC

## 2019-05-25 MED ORDER — PANTOPRAZOLE SODIUM 20 MG PO TBEC: 20.0000 mg | DELAYED_RELEASE_TABLET | Freq: Every day | ORAL | 1 refills | Status: DC

## 2019-05-25 MED ORDER — LISINOPRIL 10 MG PO TABS: 10.0000 mg | ORAL_TABLET | Freq: Every day | ORAL | 1 refills | Status: DC

## 2019-05-25 MED FILL — AMLODIPINE BESYLATE 10 MG T: 10 | 30 days supply | Qty: 30 | Fill #0

## 2019-05-25 MED FILL — PANTOPRAZOLE SOD DR 20 MG T: 20 | 30 days supply | Qty: 30 | Fill #0

## 2019-05-25 NOTE — Progress Notes (Signed)
Patient verified DOB Patient has taken medication today. Patient has eaten today. Patient denies pain at this time. 

## 2019-05-25 NOTE — Progress Notes (Signed)
Virtual Visit via Telephone Note  I connected with Stephen Oneal on 05/25/19 at  3:10 PM EDT by telephone and verified that I am speaking with the correct person using two identifiers.   I discussed the limitations, risks, security and privacy concerns of performing an evaluation and management service by telephone and the availability of in person appointments. I also discussed with the patient that there may be a patient responsible charge related to this service. The patient expressed understanding and agreed to proceed.  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  Interpreter is Cassandria Santee ID # [id number]  History of Present Illness:         43 year old male with history of hypertension and prediabetes who is status post emergency department visit on 03/07/2019 due to chest pain and patient was diagnosed with gastritis/acid reflux.(Of note, patient was seen for tele-medicine visit on 05/04/2019 with 6 month follow-up recommended.) Patient reports that as long as he takes the prescribed medication he does not have any issues with chest pain/reflux symptoms.  He continues to take his blood pressure medication and denies headache or dizziness related to his blood pressure.  He is out of one of his blood pressure medicines but he is not sure which medicine as he is currently at work and not at home and does not have the medicines with him.  Patient with history of prediabetes.  He denies any increased thirst or urinary frequency.  Overall he feels well.  He denies abdominal pain-no nausea/vomiting/diarrhea or constipation.  No shortness of breath or cough.  No fever or chills.  No peripheral edema.  No current muscle or joint pain.  Past Medical History:  Diagnosis Date  . Anxiety   . Hypertension   .  Prediabetes     History reviewed. No pertinent surgical history.  Family History  Problem Relation Age of Onset  . Diabetes Neg Hx   . Hypertension Neg Hx     Social History   Tobacco Use  . Smoking status: Former Research scientist (life sciences)  . Smokeless tobacco: Never Used  Substance Use Topics  . Alcohol use: Yes    Alcohol/week: 2.0 standard drinks    Types: 2 Cans of beer per week    Comment: daily  . Drug use: No     Allergies  Allergen Reactions  . Fish Allergy   . Shrimp [Shellfish Allergy]        Observations/Objective: No vital signs or physical exam conducted as visit was done via telephone  Assessment and Plan: 1. Essential hypertension He reports that he is compliant with medications but needs a refill of one of the 2 medications but cannot remember which one.  His prescriptions will be sent to pharmacy for refills of both amlodipine and lisinopril.  He believes that his blood pressure is controlled at this time on current medications. - amLODipine (NORVASC) 10 MG tablet; Take 1 tablet (10 mg total) by mouth daily.  Dispense: 90 tablet; Refill: 1 - lisinopril (ZESTRIL) 10 MG tablet; Take 1 tablet (10 mg total) by mouth daily.  Dispense: 90 tablet; Refill: 1  2. Gastroesophageal reflux disease without esophagitis; 2. Encounter for examination following treatment at a hospital  Patient is s/p ED visit on 03/07/2019 due to chest pain and on examination on review of records had epigastric tenderness and was diagnosed with gastritis.  He reports no issues with abdominal pain and no chest pain as long as he takes his reflux medication.  New prescription sent to pharmacy in case patient needs refill. - pantoprazole (PROTONIX) 20 MG tablet; Take 1 tablet (20 mg total) by mouth daily.  Dispense: 90 tablet; Refill: 1  4. Prediabetes Patient had hemoglobin A1c of 5.8 in February of this year.  When patient comes into pharmacy to pick up his medication, he will also have lab visit for repeat  hemoglobin A1c.  He denies any current symptoms suggestive of diabetes.  He is encouraged to continue efforts at dietary changes.  5.  Language barrier After interpreter spent several minutes trying to clarify medication issues with the patient, it appears that patient may have misplaced or lost one of his medications, possibly one of his medications for blood pressure and he needs to have the medication refilled even though it may not be time for the refill due to the fact that he no longer has the medication.  Patient was at work and not at home so he did not have his bottles with him so he is not sure which medication that he will need a new refill for but patient thinks that this may be one of his 2 blood pressure medicines.   Follow Up Instructions:Return in about 4 months (around 09/25/2019) for HTN/lipids/prediabetes with PCP.    I discussed the assessment and treatment plan with the patient. The patient was provided an opportunity to ask questions and all were answered. The patient agreed with the plan and demonstrated an understanding of the instructions.   The patient was advised to call back or seek an in-person evaluation if the symptoms worsen or if the condition fails to improve as anticipated.  I provided 16 minutes of non-face-to-face time during this encounter.   Cain Saupe, MD

## 2019-06-06 MED FILL — AMLODIPINE BESYLATE 10 MG T: 10 | 30 days supply | Qty: 30 | Fill #0

## 2019-06-06 MED FILL — PANTOPRAZOLE SOD DR 20 MG T: 20 | 30 days supply | Qty: 30 | Fill #0

## 2019-07-15 ENCOUNTER — Encounter (HOSPITAL_COMMUNITY): Payer: Self-pay

## 2019-07-15 ENCOUNTER — Emergency Department (HOSPITAL_COMMUNITY)
Admission: EM | Admit: 2019-07-15 | Discharge: 2019-07-15 | Disposition: A | Discharge status: Home / Self Care | Payer: Self-pay | Attending: Emergency Medicine | Admitting: Emergency Medicine

## 2019-07-15 ENCOUNTER — Other Ambulatory Visit: Payer: Self-pay

## 2019-07-15 ENCOUNTER — Emergency Department (HOSPITAL_COMMUNITY): Payer: Self-pay

## 2019-07-15 DIAGNOSIS — Z91018 Allergy to other foods: Secondary | ICD-10-CM | POA: Insufficient documentation

## 2019-07-15 DIAGNOSIS — F329 Major depressive disorder, single episode, unspecified: Secondary | ICD-10-CM | POA: Insufficient documentation

## 2019-07-15 DIAGNOSIS — R45851 Suicidal ideations: Secondary | ICD-10-CM | POA: Insufficient documentation

## 2019-07-15 DIAGNOSIS — F32A Depression, unspecified: Secondary | ICD-10-CM

## 2019-07-15 DIAGNOSIS — F410 Panic disorder [episodic paroxysmal anxiety] without agoraphobia: Secondary | ICD-10-CM | POA: Insufficient documentation

## 2019-07-15 DIAGNOSIS — I10 Essential (primary) hypertension: Secondary | ICD-10-CM | POA: Insufficient documentation

## 2019-07-15 DIAGNOSIS — Y908 Blood alcohol level of 240 mg/100 ml or more: Secondary | ICD-10-CM | POA: Insufficient documentation

## 2019-07-15 DIAGNOSIS — U071 COVID-19: Secondary | ICD-10-CM | POA: Insufficient documentation

## 2019-07-15 DIAGNOSIS — Z87891 Personal history of nicotine dependence: Secondary | ICD-10-CM | POA: Insufficient documentation

## 2019-07-15 DIAGNOSIS — F10929 Alcohol use, unspecified with intoxication, unspecified: Secondary | ICD-10-CM | POA: Insufficient documentation

## 2019-07-15 DIAGNOSIS — R7303 Prediabetes: Secondary | ICD-10-CM | POA: Insufficient documentation

## 2019-07-15 DIAGNOSIS — Z79899 Other long term (current) drug therapy: Secondary | ICD-10-CM | POA: Insufficient documentation

## 2019-07-15 DIAGNOSIS — Z91013 Allergy to seafood: Secondary | ICD-10-CM | POA: Insufficient documentation

## 2019-07-15 DIAGNOSIS — F1010 Alcohol abuse, uncomplicated: Secondary | ICD-10-CM

## 2019-07-15 LAB — CBC
HCT: 46 % (ref 39.0–52.0)
Hemoglobin: 16.2 g/dL (ref 13.0–17.0)
MCH: 31.8 pg (ref 26.0–34.0)
MCHC: 35.2 g/dL (ref 30.0–36.0)
MCV: 90.2 fL (ref 80.0–100.0)
Platelets: 228 10*3/uL (ref 150–400)
RBC: 5.1 MIL/uL (ref 4.22–5.81)
RDW: 11.9 % (ref 11.5–15.5)
WBC: 8.2 10*3/uL (ref 4.0–10.5)
nRBC: 0 % (ref 0.0–0.2)

## 2019-07-15 LAB — RAPID URINE DRUG SCREEN, HOSP PERFORMED
Amphetamines: NOT DETECTED
Barbiturates: NOT DETECTED
Benzodiazepines: NOT DETECTED
Cocaine: NOT DETECTED
Opiates: NOT DETECTED
Tetrahydrocannabinol: NOT DETECTED

## 2019-07-15 LAB — RESPIRATORY PANEL BY RT PCR (FLU A&B, COVID)
Influenza A by PCR: NEGATIVE
Influenza B by PCR: NEGATIVE
SARS Coronavirus 2 by RT PCR: POSITIVE — AB

## 2019-07-15 LAB — BASIC METABOLIC PANEL
Anion gap: 12 (ref 5–15)
BUN: 5 mg/dL — ABNORMAL LOW (ref 6–20)
CO2: 27 mmol/L (ref 22–32)
Calcium: 9.4 mg/dL (ref 8.9–10.3)
Chloride: 100 mmol/L (ref 98–111)
Creatinine, Ser: 0.7 mg/dL (ref 0.61–1.24)
GFR calc Af Amer: >60 mL/min (ref >60)
GFR calc non Af Amer: >60 mL/min (ref >60)
Glucose, Bld: 112 mg/dL — ABNORMAL HIGH (ref 70–99)
Potassium: 3.4 mmol/L — ABNORMAL LOW (ref 3.5–5.1)
Sodium: 139 mmol/L (ref 135–145)

## 2019-07-15 LAB — TROPONIN I (HIGH SENSITIVITY)
Troponin I (High Sensitivity): 9 ng/L (ref <18)
Troponin I (High Sensitivity): 8 ng/L (ref <18)

## 2019-07-15 LAB — ETHANOL: Alcohol, Ethyl (B): 331 mg/dL (ref <10)

## 2019-07-15 LAB — ACETAMINOPHEN LEVEL: Acetaminophen (Tylenol), Serum: <10 ug/mL — ABNORMAL LOW (ref 10–30)

## 2019-07-15 LAB — SALICYLATE LEVEL: Salicylate Lvl: <7 mg/dL (ref 2.8–30.0)

## 2019-07-15 MED ORDER — SODIUM CHLORIDE 0.9% FLUSH: 3.0000 mL | Freq: Once | INTRAVENOUS | Status: DC

## 2019-07-15 NOTE — [facility] Counselor (Addendum)
Per Priscille Loveless, PMHNP patient is psych cleared. RN, Benjamine Mola notified.

## 2019-07-15 NOTE — ED Notes (Signed)
[place] cleared pt psychologically. Pa, Stephen Oneal, notified.

## 2019-07-15 NOTE — ED Triage Notes (Signed)
Pt is spanish speaking only. C/o of CP and SOB, pt reports that the chest pain happens when ever he gets angry, and that happened last night. Pt also had nausea with episode. Pt reports that he is very depressed recently and that he has had suicidal thoughts in the past, not suicidal at the moment, no plan, denies HI, reports hearing noises that are not there. Pt reports that he drinks 2-3 beers a day.

## 2019-07-15 NOTE — ED Notes (Signed)
Pts belongings are in purple zone locker 6 and valuables (wallet, cell phone, and knife) are with security.

## 2019-07-15 NOTE — ED Notes (Signed)
Rn updated PA that pt is positive. New discharge instructions and work note created, printed, and given to pt.

## 2019-07-15 NOTE — ED Provider Notes (Signed)
[facility]   CSN: [id number] Arrival date & time: 07/15/19  0146     History Chief Complaint  Patient presents with  . Chest Pain  . Shortness of Breath  . Suicidal    Stephen Oneal is a 43 y.o. male.  Patient reports he became upset with his son last night he reports they were arguing about some legal issues that his son has.  He reports consuming alcohol after that.  He states he began having some chest pain and feeling as if he was short of breath.  Patient states he has a history of anxiety and felt as if this were anxiety.  Patient states he did not go home last night he did not go to work this morning because he was upset.  Patient states he had some feelings of harming himself however he has a family and he states he would not do that to his family patient reports chest pain has currently resolved when he was having the chest pain he states he had a tightness in his chest he has not had any fever or chills he denies any cough no chest congestion.  Denies any Covid exposures  The history is provided by the patient. No language interpreter was used.  Shortness of Breath      Past Medical History:  Diagnosis Date  . Anxiety   . Hypertension   . Prediabetes     Patient Active Problem List   Diagnosis Date Noted  . Essential hypertension 09/29/2018  . Prediabetes 09/29/2018    History reviewed. No pertinent surgical history.     Family History  Problem Relation Age of Onset  . Diabetes Neg Hx   . Hypertension Neg Hx     Social History   Tobacco Use  . Smoking status: Former Games developermoker  . Smokeless tobacco: Never Used  Substance Use Topics  . Alcohol use: Yes    Alcohol/week: 2.0 standard drinks    Types: 2 Cans of beer per week    Comment: daily  . Drug use: No    Home Medications Prior to Admission medications   Medication Sig Start Date End Date Taking? Authorizing Provider  amLODipine  (NORVASC) 10 MG tablet Take 1 tablet (10 mg total) by mouth daily. 05/25/19 08/23/19  Fulp, Cammie, MD  lisinopril (ZESTRIL) 10 MG tablet Take 1 tablet (10 mg total) by mouth daily. 05/25/19   Fulp, Cammie, MD  pantoprazole (PROTONIX) 20 MG tablet Take 1 tablet (20 mg total) by mouth daily. 05/25/19 06/24/19  Cain SaupeFulp, Cammie, MD    Allergies    Fish allergy and Shrimp [shellfish allergy]  Review of Systems   Review of Systems  All other systems reviewed and are negative.   Physical Exam Updated Vital Signs BP 123/83   Pulse 77   Temp 99 F (37.2 C) (Oral)   Resp 17   SpO2 97%   Physical Exam Vitals and nursing note reviewed.  Constitutional:      Appearance: He is well-developed.  HENT:     Head: Normocephalic and atraumatic.  Eyes:     Conjunctiva/sclera: Conjunctivae normal.  Cardiovascular:     Rate and Rhythm: Normal rate and regular rhythm.     Heart sounds: Normal heart sounds. No murmur.  Pulmonary:     Effort: Pulmonary effort is normal. No respiratory distress.     Breath sounds: Normal breath sounds.  Abdominal:     Palpations: Abdomen is soft.  Tenderness: There is no abdominal tenderness.  Musculoskeletal:     Cervical back: Normal range of motion and neck supple.  Skin:    General: Skin is warm and dry.  Neurological:     General: No focal deficit present.     Mental Status: He is alert.     ED Results / Procedures / Treatments   Labs (all labs ordered are listed, but only abnormal results are displayed) Labs Reviewed  RESPIRATORY PANEL BY RT PCR (FLU A&B, COVID) - Abnormal; Notable for the following components:      Result Value   SARS Coronavirus 2 by RT PCR POSITIVE (*)    All other components within normal limits  BASIC METABOLIC PANEL - Abnormal; Notable for the following components:   Potassium 3.4 (*)    Glucose, Bld 112 (*)    BUN 5 (*)    All other components within normal limits  ETHANOL - Abnormal; Notable for the following  components:   Alcohol, Ethyl (B) 331 (*)    All other components within normal limits  ACETAMINOPHEN LEVEL - Abnormal; Notable for the following components:   Acetaminophen (Tylenol), Serum <10 (*)    All other components within normal limits  CBC  SALICYLATE LEVEL  RAPID URINE DRUG SCREEN, HOSP PERFORMED  TROPONIN I (HIGH SENSITIVITY)  TROPONIN I (HIGH SENSITIVITY)    EKG EKG Interpretation  Date/Time:  Friday July 15 2019 01:58:51 EST Ventricular Rate:  111 PR Interval:  174 QRS Duration: 72 QT Interval:  346 QTC Calculation: 470 R Axis:   69 Text Interpretation: Sinus tachycardia Possible Anterior infarct , age undetermined Abnormal ECG Otherwise no significant change Confirmed by Drema Pry [id number]) on 07/15/2019 6:20:26 AM   Radiology DG Chest 2 View  Result Date: 07/15/2019 CLINICAL DATA:  Chest pain and shortness of breath. EXAM: CHEST - 2 VIEW COMPARISON:  03/07/2019 FINDINGS: The cardiomediastinal contours are normal. The lungs are clear. Pulmonary vasculature is normal. No consolidation, pleural effusion, or pneumothorax. No acute osseous abnormalities are seen. IMPRESSION: No acute chest findings. Electronically Signed   By: Narda Rutherford M.D.   On: 07/15/2019 02:57    Procedures Procedures (including critical care time)  Medications Ordered in ED Medications  sodium chloride flush (NS) 0.9 % injection 3 mL (has no administration in time range)    ED Course  I have reviewed the triage vital signs and the nursing notes.  Pertinent labs & imaging results that were available during my care of the patient were reviewed by me and considered in my medical decision making (see chart for details).    MDM Rules/Calculators/A&P     CHA2DS2/VAS Stroke Risk Points      N/A >= 2 Points: High Risk  1 - 1.99 Points: Medium Risk  0 Points: Low Risk    A final score could not be computed because of missing components.: Last  Change: N/A     This score  determines the patient's risk of having a stroke if the  patient has atrial fibrillation.      This score is not applicable to this patient. Components are not  calculated.                   Patient had an alcohol level of 331 EKG is nonacute chest x-ray is normal troponin is negative. After cardiac evaluation is complete TTS assessed patient for depression they have recommended outpatient follow-up.  His Covid test did return while he was in  the emergency department and is positive.  Patient is given information on quarantining and is given a note to be out of work for 10 days Final Clinical Impression(s) / ED Diagnoses Final diagnoses:  Depression, unspecified depression type  Alcohol abuse    Rx / DC Orders ED Discharge Orders    None    An After Visit Summary was printed and given to the patient.    Fransico Meadow, PA-C 07/15/19 1303    Virgel Manifold, MD 07/19/19 [phone]
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
## 2019-08-19 MED FILL — PANTOPRAZOLE SOD DR 20 MG T: 20 | 30 days supply | Qty: 30 | Fill #1

## 2019-08-19 MED FILL — LISINOPRIL 10 MG TABS: 10 | 30 days supply | Qty: 30 | Fill #0

## 2019-08-19 MED FILL — AMLODIPINE BESYLATE 10 MG T: 10 | 30 days supply | Qty: 30 | Fill #1

## 2019-09-29 ENCOUNTER — Other Ambulatory Visit: Payer: Self-pay

## 2019-09-29 ENCOUNTER — Encounter: Payer: Self-pay | Admitting: Nurse Practitioner

## 2019-09-29 ENCOUNTER — Ambulatory Visit: Payer: Self-pay | Attending: Nurse Practitioner | Admitting: Nurse Practitioner

## 2019-09-29 DIAGNOSIS — I10 Essential (primary) hypertension: Secondary | ICD-10-CM

## 2019-09-29 DIAGNOSIS — K219 Gastro-esophageal reflux disease without esophagitis: Secondary | ICD-10-CM

## 2019-09-29 MED ORDER — AMLODIPINE BESYLATE 10 MG PO TABS: 10.0000 mg | ORAL_TABLET | Freq: Every day | ORAL | 1 refills | Status: DC

## 2019-09-29 MED ORDER — LISINOPRIL 10 MG PO TABS: 10.0000 mg | ORAL_TABLET | Freq: Every day | ORAL | 1 refills | Status: DC

## 2019-09-29 MED ORDER — PANTOPRAZOLE SODIUM 20 MG PO TBEC: 20.0000 mg | DELAYED_RELEASE_TABLET | Freq: Every day | ORAL | 1 refills | Status: DC

## 2019-09-29 NOTE — Progress Notes (Signed)
Virtual Visit via Telephone Note Due to national recommendations of social distancing due to [place], telehealth visit is felt to be most appropriate for this patient at this time.  I discussed the limitations, risks, security and privacy concerns of performing an evaluation and management service by telephone and the availability of in person appointments. I also discussed with the patient that there may be a patient responsible charge related to this service. The patient expressed understanding and agreed to proceed.    I connected with Stephen Oneal on 09/29/19  at   4:10 PM EST  EDT by telephone and verified that I am speaking with the correct person using two identifiers.   Consent I discussed the limitations, risks, security and privacy concerns of performing an evaluation and management service by telephone and the availability of in person appointments. I also discussed with the patient that there may be a patient responsible charge related to this service. The patient expressed understanding and agreed to proceed.   Location of Patient: Private Residence    Location of Provider: [place] [facility] [place] participating in Telemedicine visit: [name] FNP-BC [place] Interpreter Stephen Oneal ID [phone]   History of Present Illness: Telemedicine visit for: Follow Up  Essential Hypertension Taking amlodipine 10 mg and lisinopril 10 mg daily. Denies chest pain, shortness of breath, palpitations, lightheadedness, dizziness, headaches or BLE edema. He does not monitor his blood pressure as he does not have a monitoring device.  BP Readings from Last 3 Encounters:  07/15/19 123/83  03/07/19 130/87  11/16/18 135/78   GERD Symptoms well controlled with pantoprazole 20 mg daily. The patient denies abdominal bloating, bilious reflux, chest pain, fullness after meals, hematemesis, midespigastric pain and  nocturnal burning.   Past Medical History:  Diagnosis Date  . Anxiety   . Hypertension   . Prediabetes     History reviewed. No pertinent surgical history.  Family History  Problem Relation Age of Onset  . Diabetes Neg Hx   . Hypertension Neg Hx     Social History   Socioeconomic History  . Marital status: Married    Spouse name: Not on file  . Number of children: Not on file  . Years of education: Not on file  . Highest education level: Not on file  Occupational History  . Not on file  Tobacco Use  . Smoking status: Former Research scientist (life sciences)  . Smokeless tobacco: Never Used  Substance and Sexual Activity  . Alcohol use: Yes    Alcohol/week: 2.0 standard drinks    Types: 2 Cans of beer per week    Comment: daily  . Drug use: No  . Sexual activity: Yes  Other Topics Concern  . Not on file  Social History Narrative  . Not on file   Social Determinants of Health   Financial Resource Strain:   . Difficulty of Paying Living Expenses: Not on file  Food Insecurity:   . Worried About Charity fundraiser in the Last Year: Not on file  . Ran Out of Food in the Last Year: Not on file  Transportation Needs:   . Lack of Transportation (Medical): Not on file  . Lack of Transportation (Non-Medical): Not on file  Physical Activity:   . Days of Exercise per Week: Not on file  . Minutes of Exercise per Session: Not on file  Stress:   . Feeling of Stress : Not on file  Social  Connections:   . Frequency of Communication with Friends and Family: Not on file  . Frequency of Social Gatherings with Friends and Family: Not on file  . Attends Religious Services: Not on file  . Active Member of Clubs or Organizations: Not on file  . Attends Banker Meetings: Not on file  . Marital Status: Not on file     Observations/Objective: Awake, alert and oriented x 3   Review of Systems  Constitutional: Negative for fever, malaise/fatigue and weight loss.  HENT: Negative.  Negative  for nosebleeds.   Eyes: Negative.  Negative for blurred vision, double vision and photophobia.  Respiratory: Negative.  Negative for cough and shortness of breath.   Cardiovascular: Negative.  Negative for chest pain, palpitations and leg swelling.  Gastrointestinal: Positive for heartburn. Negative for nausea and vomiting.  Musculoskeletal: Negative.  Negative for myalgias.  Neurological: Negative.  Negative for dizziness, focal weakness, seizures and headaches.  Psychiatric/Behavioral: Negative.  Negative for suicidal ideas.    Assessment and Plan: [name] was seen today for medication refill.  Diagnoses and all orders for this visit:  Essential hypertension -     lisinopril (ZESTRIL) 10 MG tablet; Take 1 tablet (10 mg total) by mouth daily. -     amLODipine (NORVASC) 10 MG tablet; Take 1 tablet (10 mg total) by mouth daily. Continue all antihypertensives as prescribed.  Remember to bring in your blood pressure log with you for your follow up appointment.  DASH/Mediterranean Diets are healthier choices for HTN.   Gastroesophageal reflux disease without esophagitis -     pantoprazole (PROTONIX) 20 MG tablet; Take 1 tablet (20 mg total) by mouth daily. INSTRUCTIONS: Avoid GERD Triggers: acidic, spicy or fried foods, caffeine, coffee, sodas,  alcohol and chocolate.     Follow Up Instructions Return in about 3 months (around 12/27/2019).     I discussed the assessment and treatment plan with the patient. The patient was provided an opportunity to ask questions and all were answered. The patient agreed with the plan and demonstrated an understanding of the instructions.   The patient was advised to call back or seek an in-person evaluation if the symptoms worsen or if the condition fails to improve as anticipated.  I provided 14 minutes of non-face-to-face time during this encounter including median intraservice time, reviewing previous notes, labs, imaging, medications and explaining  diagnosis and management.  Claiborne Rigg, FNP-BC

## 2019-09-30 MED FILL — PANTOPRAZOLE SOD DR 20 MG T: 20 | 30 days supply | Qty: 30 | Fill #0

## 2019-09-30 MED FILL — LISINOPRIL 10 MG TABS: 10 | 30 days supply | Qty: 30 | Fill #0

## 2019-09-30 MED FILL — AMLODIPINE BESYLATE 10 MG T: 10 | 30 days supply | Qty: 30 | Fill #0

## 2019-10-06 ENCOUNTER — Ambulatory Visit: Payer: Self-pay | Attending: Nurse Practitioner

## 2019-10-06 ENCOUNTER — Other Ambulatory Visit: Payer: Self-pay

## 2019-10-06 DIAGNOSIS — R7303 Prediabetes: Secondary | ICD-10-CM

## 2019-10-07 LAB — HEMOGLOBIN A1C
Est. average glucose Bld gHb Est-mCnc: 117 mg/dL
Hgb A1c MFr Bld: 5.7 % — ABNORMAL HIGH (ref 4.8–5.6)

## 2019-10-25 ENCOUNTER — Other Ambulatory Visit: Payer: Self-pay

## 2019-10-25 ENCOUNTER — Ambulatory Visit: Payer: Self-pay | Attending: Nurse Practitioner | Admitting: Physician Assistant

## 2019-10-25 VITALS — BP 138/87 | HR 94 | Temp 97.3°F | Resp 18 | Ht 63.0 in | Wt 130.0 lb

## 2019-10-25 DIAGNOSIS — Z87891 Personal history of nicotine dependence: Secondary | ICD-10-CM | POA: Insufficient documentation

## 2019-10-25 DIAGNOSIS — F419 Anxiety disorder, unspecified: Secondary | ICD-10-CM | POA: Insufficient documentation

## 2019-10-25 DIAGNOSIS — Z79899 Other long term (current) drug therapy: Secondary | ICD-10-CM | POA: Insufficient documentation

## 2019-10-25 DIAGNOSIS — I10 Essential (primary) hypertension: Secondary | ICD-10-CM | POA: Insufficient documentation

## 2019-10-25 DIAGNOSIS — R7303 Prediabetes: Secondary | ICD-10-CM | POA: Insufficient documentation

## 2019-10-25 DIAGNOSIS — Z91013 Allergy to seafood: Secondary | ICD-10-CM | POA: Insufficient documentation

## 2019-10-25 DIAGNOSIS — R0789 Other chest pain: Secondary | ICD-10-CM | POA: Insufficient documentation

## 2019-10-25 MED ORDER — HYDROXYZINE HCL 10 MG PO TABS: 10.0000 mg | ORAL_TABLET | Freq: Three times a day (TID) | ORAL | 0 refills | Status: DC | PRN

## 2019-10-25 MED ORDER — FLUOXETINE HCL 20 MG PO TABS: 20.0000 mg | ORAL_TABLET | Freq: Every day | ORAL | 3 refills | Status: DC

## 2019-10-25 MED ORDER — BLOOD GLUCOSE METER KIT: PACK | 0 refills | Status: AC

## 2019-10-25 MED ORDER — BLOOD GLUCOSE METER KIT: PACK | 0 refills | Status: DC

## 2019-10-25 MED FILL — hydrOXYzine HCL 10 MG TABS: 10 | 10 days supply | Qty: 30 | Fill #0

## 2019-10-25 MED FILL — FLUoxetine HCL 20 MG TABS: 20 | 30 days supply | Qty: 30 | Fill #0

## 2019-10-25 NOTE — Progress Notes (Signed)
C/o a lot of stress because of his son  Anxiety  EKG done today /

## 2019-10-25 NOTE — Patient Instructions (Addendum)
I am prescribing Prozac, this will help with anxiety, you will need to take it every day.  You may not notice any difference for a few weeks.    You may use hydroxyzine 10 mg every 8 hours to help with anxiety symptoms  I also want you to start Vit D 2,000 unit every day, you will purchase this over the counter  Trastorno de ansiedad generalizada, en adultos Generalized Anxiety Disorder, Adult El trastorno de ansiedad generalizada (TAG) es un trastorno de salud mental. Las personas con esta afeccin se preocupan constantemente por los eventos de [employer]. A diferencia de la ansiedad normal, la preocupacin relacionada con el TAG no se produce por un evento especfico. Estas preocupaciones tampoco desaparecen ni mejoran con el tiempo. EL TAG interfiere con las funciones de la vida, incluidas las [place], el trabajo y Production designer, theatre/television/film. El TAG puede variar de leve a grave. Las personas con TAG grave tienen intensas oleadas de ansiedad con sntomas fsicos (crisis de [place]). Cules son las causas? Se desconoce la causa exacta del TAG. Qu incrementa el riesgo? Es ms probable que esta afeccin se manifieste en:  Mujeres.  Las [employer] tienen antecedentes familiares de trastornos de [place].  Las personas que son Lynnae Sandhoff tmidas.  Las [employer] experimentan eventos muy estresantes en la vida, tales como la muerte de un ser querido.  Las personas que tienen un entorno Energy manager. Cules son los signos o los sntomas? Con frecuencia, las personas que padecen el TAG se preocupan excesivamente por muchas cosas en la vida, tales como su salud y [place]. Tambin pueden experimentar una preocupacin desmedida por lo siguiente:  Hacer las [employer].  Llegar a tiempo.  Los [employer]  Las [employer]. Los sntomas fsicos del TAG incluyen:  Fatiga.  Tensin muscular o contracciones musculares.  Temblores o agitacin.  Sobresaltarse con facilidad.   Sentir que el corazn late fuerte o est acelerado.  Sentir falta de aire o como que no se puede respirar profundamente.  Problemas para quedarse dormido o para seguir durmiendo.  Sudoracin.  Nuseas, diarrea o sndrome del intestino irritable (SII).  Dolores de [place].  Dificultad para concentrarse o recordar hechos.  Agitacin.  Irritabilidad. Cmo se diagnostica? El mdico puede diagnosticar el TAG en funcin de los sntomas y los antecedentes mdicos. Se le Medical sales representative examen fsico. El mdico le har preguntas especficas sobre sus sntomas, que incluyen qu tan graves son, cundo comenzaron y si Zenaida Niece y vienen. Tambin puede hacerle preguntas sobre el consumo de alcohol o drogas, incluidos los medicamentos recetados. Su mdico podr derivarlo a un especialista de salud mental para ms evaluaciones. Su mdico le Charity fundraiser exhaustivo y puede hacer pruebas adicionales para descartar otras posibles causas de sus sntomas. Para recibir un diagnstico del TAG, una persona debe tener ansiedad que:  Est fuera de su control.  Afecte distintos aspectos de su vida, como el trabajo y [employer].  Cause angustia que le impida participar en sus actividades habituales.  Incluya al menos tres sntomas fsicos del TAG tales como inquietud, fatiga, dificultad para concentrarse, irritabilidad, tensin muscular o problemas para dormir. Antes de que su mdico pueda confirmar un diagnstico del TAG, estos sntomas deben estar presentes ms [employer] no lo estn y deben tener una duracin de seis meses o ms. Cmo se trata? Las siguientes terapias se usan generalmente para tratar el TAG:  Medicamentos. Por lo general, los medicamentos antidepresivos se recetan para un  control diario a Barrister's clerk. Se pueden agregar medicamentos para la ansiedad en casos graves, especialmente cuando ocurren crisis de [place].  Psicoterapia (psicoanlisis). Determinados tipos de psicoterapia  pueden ser tiles para tratar el TAG al brindar 11, educacin y orientacin. [place] opciones se incluyen las siguientes: ? Terapia cognitivo conductual (TCC). Las personas aprenden habilidades para sobrellevar situaciones y tcnicas para Human resources officer ansiedad. Aprenden a identificar conductas y pensamientos irreales o negativos, y a reemplazarlos por positivos. ? Terapia de aceptacin y compromiso (acceptance and commitment therapy, ACT). Este tratamiento ensea a las personas a ser conscientes como una forma de lidiar con pensamientos y sentimientos no deseados. ? Biorretroalimentacin. Este proceso lo capacita para Aeronautical engineer respuesta del cuerpo Insurance risk surveyor) a travs de tcnicas de respiracin y mtodos de relajacin. Usted trabajar con un terapeuta mientras se usan mquinas para controlar sus sntomas fsicos.  Tcnicas para Scientific laboratory technician. Estas incluyen yoga, meditacin y ejercicio. Un especialista en salud mental puede ayudar a determinar qu tratamiento es el mejor para usted. Algunas Water quality scientist con solo un tipo de [place]. Sin embargo, Producer, television/film/video requieren una combinacin de terapias. Siga estas instrucciones en su casa:  Tome los medicamentos de venta libre y los recetados solamente como se lo haya indicado el mdico.  Trate de mantener una rutina normal.  Trate de anticipar situaciones estresantes y permita un tiempo adicional para controlarlas.  Participe de cualquier tcnica para autocalmarse o controlar el estrs segn las indicaciones de su mdico.  No se castigue ante los retrocesos o por no Librarian, academic.  Trate de reconocer sus logros aunque sean pequeos.  Concurra a todas las visitas de seguimiento como se lo haya indicado el mdico. Esto es importante. Comunquese con un mdico si:  Los sntomas no mejoran.  Los sntomas empeoran.  Tiene signos de depresin tales como: ? Tristeza, mal humor o irritabilidad. ? Ya no  disfruta de Engineering geologist. ? Cambios en el peso y o en sus hbitos de alimentacin. ? Cambios en los hbitos de sueo. ? Evita a amigos y familiares. ? No tiene energa para realizar las tareas habituales. ? Tiene sentimientos de culpa o de minusvala. Solicite ayuda de inmediato si:  Tiene pensamientos serios acerca de lastimarse a usted mismo o daar a Producer, television/film/video. Si alguna vez siente que puede lastimarse o Market researcher a los dems, o piensa en poner fin a su vida, busque ayuda de inmediato. Puede dirigirse al servicio de urgencias ms cercano o comunicarse con:  El servicio de Multimedia programmer de su localidad (911 en los [place]).  Una lnea de asistencia al suicida y Freight forwarder en crisis, como la [place] [employer] de Prevencin del Suicidio (National Suicide Prevention Lifeline) al [phone]. Est disponible las 24 horas del da. Resumen  El trastorno de ansiedad generalizada (TAG) es un trastorno de salud mental que implica preocupacin no causada por un evento especfico.  Con frecuencia, las personas que padecen el TAG se preocupan excesivamente por muchas cosas en la vida, tales como su salud y [place].  El TAG puede causar sntomas fsicos tales como inquietud, dificultad para concentrarse, problemas para dormir, sudoracin frecuente, nuseas, diarrea, dolores de [place] y temblores, o contracciones musculares.  Un especialista en salud mental puede ayudar a determinar qu tratamiento es el mejor para usted. Algunas Water quality scientist con solo un tipo de [place]. Sin embargo, Producer, television/film/video requieren una combinacin de terapias. Esta informacin no tiene Marine scientist el consejo del  mdico. Asegrese de hacerle al mdico cualquier pregunta que tenga. Document Revised: 10/24/2016 Document Reviewed: 10/24/2016 Elsevier Patient Education  2020 [employer].  Control de la glucemia, en adultos Blood Glucose Monitoring, Adult Controlar el  nivel de azcar en la sangre (glucosa) es una parte importante del tratamiento de su diabetes (diabetes mellitus). El control de la glucemia implica realizar controles regulares segn le indiquen, y Midwife un registro de los resultados (registro diario) a lo largo del [place]. Controlar la glucemia en forma peridica y llevar un registro diario puede:  Ayudarlos a usted y al mdico a Theme park manager de control de la diabetes segn sea necesario, lo que incluye medicamentos o insulina.  Ayudarlo a usted a comprender de [employer], la actividad fsica, las enfermedades y los medicamentos inciden en la glucemia.  Permitirle a usted saber cul es su nivel de glucemia en cualquier momento. Averiguar rpidamente si su nivel de glucemia es bajo (hipoglucemia) o alto (hiperglucemia). El [employer] objetivos personalizados de su tratamiento. Los objetivos estarn basados en su edad, en otras enfermedades que tenga y en cmo responde al tratamiento de la diabetes. Generalmente, el objetivo del tratamiento es [employer] siguientes niveles de glucemia:  Antes de las comidas (preprandial): de 80 a 130mg /dl (4,4 a ).  Despus de las comidas (posprandial): por debajo de 180mg /dl (9,6PRFF/M).  Nivel de A1c: menos del 7%. Materiales necesarios:  Medidor de glucemia.  Tiras reactivas para el medidor. Cada medidor tiene sus propias tiras reactivas. Debe usar las tiras reactivas que trajo su medidor.  Una aguja para pincharse el dedo (lanceta). No use una lanceta ms de una vez.  Un dispositivo que sujeta la lanceta (dispositivo de puncin).  Un diario o cuaderno de anotaciones para . Cmo controlar su glucemia  1. Lvese las manos con agua y [id number]. 2. Pnchese el costado del dedo (no en la punta) con la lanceta. Use un dedo diferente cada vez. 3. Frote suavemente el dedo hasta que aparezca una pequea gota de [place]. 4. Siga las instrucciones  que vienen con el medidor para [place] tira [place], Public affairs consultant la sangre sobre la tira y usar el medidor de glucemia. 5. Registre su resultado y las observaciones que desee. Algunos medidores le permiten tomar sangre para la prueba de otras zonas del cuerpo que no son el dedo (zonas alternativas). Las zonas alternativas ms comunes son las siguientes:  Los Firefighter.  Los muslos.  La palma de la mano. Si cree que puede tener hipoglucemia, o si tiene antecedentes de no saber cundo le est bajando el nivel de glucemia (hipoglucemia asintomtica), no use zonas alternativas. En su lugar, use los dedos. Es posible que las zonas alternativas no sean tan precisas como los dedos porque el flujo de sangre es ms lento en esas zonas. Esto significa que el resultado que obtiene de estas zonas puede estar retrasado y ser un poco diferente del resultado que obtendra de su dedo. Siga estas indicaciones en su casa: Registro diario de glucemia   Cada vez que controle su nivel de glucemia, anote el resultado. Tambin anote aquellos factores que puedan estar afectando su nivel de glucemia, como la dieta y la actividad fsica Contractor. Esta informacin puede ayudarlos a usted y a su mdico a: ? Paediatric nurse patrones en su glucemia durante el transcurso del [place]. ? Ajustar su plan de control de la diabetes segn sea necesario.  Averige si su Architect sus registros  en una computadora. La [employer] de los medidores de glucosa guardan un registro de las lecturas realizadas con el medidor. Si usted tiene diabetes tipo 1:  Controle su nivel de glucemia 2 o ms veces al da.  Tambin controle su nivel de glucemia: ? Antes de cada inyeccin de insulina. ? Antes y despus de hacer ejercicio. ? Antes de comer. ? 2 horas despus de una comida. ? Ocasionalmente, entre las 2:00a.m. y las 3:00a.m., como se lo hayan indicado. ? Antes de Education officer, environmental tareas peligrosas, Sport and exercise psychologist o usar  maquinaria pesada. ? A la hora de acostarse.  Es posible que Chemical engineer con ms frecuencia sus niveles de glucemia, hasta 6 a 10veces por da, si: ? Usted [place] una bomba de insulina. ? Usted necesita mltiples inyecciones diarias (MDI, por sus siglas en ingls). ? Tiene diabetes que no est bien controlada. ? Usted est enfermo. ? Usted tiene antecedentes de hipoglucemia grave. ? Usted tiene hipoglucemia asintomtica. Si usted tiene diabetes tipo 2:  Si recibe insulina u otros medicamentos para la diabetes, controle su nivel de glucemia 2 o ms veces al Futures trader.  Si recibe tratamiento intensivo con insulina, debe medirse el nivel de glucemia 4o ms veces al [employer]. Ocasionalmente, es posible que deba controlarlo entre las 2:00a.m. y las 3:00a.m., segn se lo indiquen.  Tambin controle su nivel de glucemia: ? Antes y despus de hacer ejercicio. ? Antes de Education officer, environmental tareas peligrosas, Sport and exercise psychologist o usar maquinaria pesada.  Es posible que deba controlar con ms frecuencia su nivel de glucemia si: ? Necesita ajustar la dosis de sus medicamentos. ? Su diabetes no est bien controlada. ? Est enfermo. Consejos generales  Siempre tenga sus insumos a mano.  Todos los medidores de glucemia incluyen un nmero de telfono "directo", disponible las 24 horas, al que podr llamar si tiene preguntas o [place]. Tambin puede consultar a su mdico.  Despus de usar algunas cajas de tiras reactivas, ajuste (calibre) el medidor de glucemia segn las instrucciones del medidor. Comunquese con un mdico si:  El nivel de glucemia es mayor o igual que 240mg /dl ( ) durante 2das seguidos.  Ha estado enfermo o ha tenido fiebre durante ms de 2das y no mejora.  Tiene alguno de los siguientes problemas durante ms de 6horas: ? No puede comer ni beber. ? Tiene nuseas o vmitos. ? Tiene diarrea. Solicite ayuda de inmediato si:  Su nivel de glucemia est por debajo de 54mg /dl  (83mmol/l).  Se siente confundido o tiene dificultad para pensar con claridad.  Tiene dificultad para respirar.  Tiene un nivel moderado o alto de cetonas en la [place]. Resumen  Controlar el nivel de azcar en la sangre (glucosa) es una parte importante del tratamiento de su diabetes (diabetes mellitus).  El control de la glucemia implica realizar controles regulares segn le indiquen, y 1m un registro de los resultados (registro diario) a lo largo del [place].  El Midwife objetivos personalizados de su tratamiento. Los objetivos estarn basados en su edad, en otras enfermedades que tenga y en cmo responde al tratamiento de la diabetes.  Cada vez que controle su nivel de glucemia, anote el resultado. Tambin anote aquellos factores que puedan estar afectando su nivel de glucemia, como la dieta y la actividad fsica [place]. Esta informacin no tiene [employer] el consejo del mdico. Asegrese de hacerle al mdico cualquier pregunta que tenga. Document Revised: 10/30/2017 Document Reviewed: 12/31/2015 Elsevier Patient Education  2020 11/01/2017.

## 2019-10-25 NOTE — Progress Notes (Signed)
Established Patient Office Visit  Subjective:  Patient ID: Stephen Oneal, male    DOB: November 01, 1975  Age: 44 y.o. MRN: [id number]  CC:  Chief Complaint  Patient presents with  . Anxiety    HPI Stephen Oneal presents for increased anxiety, chest pains  Wife is present. Due to language barrier, an interpreter was present during the history-taking and subsequent discussion (and for part of the physical exam) with this patient.  Reports that he has been having heightened anxiety, chest pain, states that it is situational, episodic.  Reports that he will wake up feeling anxious, states that he will feel nauseous, hands will be shaking, feels pressure in his chest.  Reports this has been going on for the past year, seems to be happening more often. Will last for 15-30 minutes , sometimes longer.  Was recently seen in the emergency department in December 2020, for same complaint, was diagnosed at that time with COVID-19, anxiety was not addressed.  Reports that he has no difficulty falling asleep or staying asleep, does endorse that if his son is not at home, he does sleep "uneasy".  Reports that he has been having difficulty with his son and this has increased his anxiety as well.  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  Does endorse that he recently started counseling.   Past Medical History:  Diagnosis Date  . Anxiety   . Hypertension   . Prediabetes     History reviewed. No pertinent surgical history.  Family History  Problem Relation Age of Onset  . Diabetes Neg Hx   . Hypertension Neg Hx     Social History   Socioeconomic History  . Marital status: Married    Spouse name: Not on file  . Number of children: Not on file  . Years of education: Not on file  . Highest education level:  Not on file  Occupational History  . Not on file  Tobacco Use  . Smoking status: Former Research scientist (life sciences)  . Smokeless tobacco: Never Used  Substance and Sexual Activity  . Alcohol use: Yes    Alcohol/week: 2.0 standard drinks    Types: 2 Cans of beer per week    Comment: daily  . Drug use: No  . Sexual activity: Yes  Other Topics Concern  . Not on file  Social History Narrative  . Not on file   Social Determinants of Health   Financial Resource Strain:   . Difficulty of Paying Living Expenses:   Food Insecurity:   . Worried About Charity fundraiser in the Last Year:   . Arboriculturist in the Last Year:   Transportation Needs:   . Film/video editor (Medical):   Marland Kitchen Lack of Transportation (Non-Medical):   Physical Activity:   . Days of Exercise per Week:   . Minutes of Exercise per Session:   Stress:   . Feeling of Stress :   Social Connections:   . Frequency of Communication with Friends and Family:   . Frequency of Social Gatherings with Friends and Family:   . Attends Religious Services:   . Active Member of Clubs or Organizations:   . Attends Archivist Meetings:   Marland Kitchen Marital Status:   Intimate Partner Violence:   . Fear of Current  or Ex-Partner:   . Emotionally Abused:   Marland Kitchen Physically Abused:   . Sexually Abused:     Outpatient Medications Prior to Visit  Medication Sig Dispense Refill  . amLODipine (NORVASC) 10 MG tablet Take 1 tablet (10 mg total) by mouth daily. 90 tablet 1  . lisinopril (ZESTRIL) 10 MG tablet Take 1 tablet (10 mg total) by mouth daily. 90 tablet 1  . pantoprazole (PROTONIX) 20 MG tablet Take 1 tablet (20 mg total) by mouth daily. 90 tablet 1   No facility-administered medications prior to visit.    Allergies  Allergen Reactions  . Fish Allergy   . Shrimp [Shellfish Allergy]     ROS Review of Systems  Constitutional: Negative.   HENT: Negative.   Eyes: Negative.   Respiratory: Positive for chest tightness and shortness of  breath.   Cardiovascular: Positive for chest pain. Negative for palpitations.  Gastrointestinal: Negative.   Endocrine: Negative.   Genitourinary: Negative.   Musculoskeletal: Negative.   Skin: Negative.   Allergic/Immunologic: Negative.   Neurological: Negative for dizziness, syncope and light-headedness.  Hematological: Negative.   Psychiatric/Behavioral: Positive for dysphoric mood and sleep disturbance. Negative for self-injury and suicidal ideas. The patient is nervous/anxious.       Objective:    Physical Exam  Constitutional: He is oriented to person, place, and time. He appears well-developed and well-nourished.  HENT:  Head: Normocephalic and atraumatic.  Right Ear: External ear normal.  Left Ear: External ear normal.  Nose: Nose normal.  Mouth/Throat: Oropharynx is clear and moist.  Eyes: Pupils are equal, round, and reactive to light. Conjunctivae and EOM are normal.  Cardiovascular: Normal rate, regular rhythm and normal heart sounds.  Pulmonary/Chest: Effort normal and breath sounds normal.  Abdominal: Soft. Bowel sounds are normal.  Musculoskeletal:        General: Normal range of motion.     Cervical back: Normal range of motion and neck supple.  Neurological: He is alert and oriented to person, place, and time.  Skin: Skin is warm and dry.  Psychiatric: He has a normal mood and affect. His behavior is normal. Judgment and thought content normal.    BP 138/87 (BP Location: Left Arm, Patient Position: Sitting, Cuff Size: Small)   Pulse 94   Temp (!) 97.3 F (36.3 C)   Resp 18   Ht _0  (1.6 m)   Wt 130 lb (59 kg)   SpO2 99%   BMI 23.03 kg/m  Wt Readings from Last 3 Encounters:  10/25/19 130 lb (59 kg)  09/29/18 136 lb (61.7 kg)  05/19/18 131 lb 12.8 oz (59.8 kg)     Health Maintenance Due  Topic Date Due  . HIV Screening  Never done  . TETANUS/TDAP  Never done  . INFLUENZA VACCINE  Never done    There are no preventive care reminders to  display for this patient.  Lab Results  Component Value Date   TSH 4.630 (H) 02/18/2018   Lab Results  Component Value Date   WBC 8.2 07/15/2019   HGB 16.2 07/15/2019   HCT 46.0 07/15/2019   MCV 90.2 07/15/2019   PLT 228 07/15/2019   Lab Results  Component Value Date   NA 139 07/15/2019   K 3.4 (L) 07/15/2019   CO2 27 07/15/2019   GLUCOSE 112 (H) 07/15/2019   BUN 5 (L) 07/15/2019   CREATININE 0.70 07/15/2019   BILITOT 0.6 02/01/2018   ALKPHOS 75 02/01/2018   AST 45 (H) 02/01/2018  ALT 38 02/01/2018   PROT 7.9 02/01/2018   ALBUMIN 4.5 02/01/2018   CALCIUM 9.4 07/15/2019   ANIONGAP 12 07/15/2019   Lab Results  Component Value Date   CHOL 211 (H) 10/21/2018   Lab Results  Component Value Date   HDL 51 10/21/2018   Lab Results  Component Value Date   LDLCALC 129 (H) 10/21/2018   Lab Results  Component Value Date   TRIG 154 (H) 10/21/2018   Lab Results  Component Value Date   CHOLHDL 4.1 10/21/2018   Lab Results  Component Value Date   HGBA1C 5.7 (H) 10/06/2019      Assessment & Plan:   Problem List Items Addressed This Visit      Other   Prediabetes   Relevant Medications   blood glucose meter kit and supplies    Other Visit Diagnoses    Anxiety    -  Primary   Relevant Medications   FLUoxetine (PROZAC) 20 MG tablet   hydrOXYzine (ATARAX/VISTARIL) 10 MG tablet   Other chest pain       Relevant Orders   EKG 12-Lead    1. Anxiety Gave patient education on coping skills, continue counseling, trial vitamin D 2000 units over-the-counter - FLUoxetine (PROZAC) 20 MG tablet; Take 1 tablet (20 mg total) by mouth daily.  Dispense: 30 tablet; Refill: 3 - hydrOXYzine (ATARAX/VISTARIL) 10 MG tablet; Take 1 tablet (10 mg total) by mouth 3 (three) times daily as needed.  Dispense: 30 tablet; Refill: 0  2. Other chest pain Rule out any cardiac involvement, compared to EKG completed at the emergency department in December 2020. - EKG 12-Lead - within  normal limits  3. Prediabetes Patient was concerned that he was given diagnosis of prediabetes at his last office visit, would like to start working on checking his blood sugar and improving his diet. Patient education given on how and when to check his FBG.  Trial Vit D 2,000 units OTC  - blood glucose meter kit and supplies; Dispense based on patient and insurance preference. Use up to four times daily as directed. (FOR ICD-10 E10.9, E11.9).  Dispense: 1 each; Refill: 0   Meds ordered this encounter  Medications  . FLUoxetine (PROZAC) 20 MG tablet    Sig: Take 1 tablet (20 mg total) by mouth daily.    Dispense:  30 tablet    Refill:  3    Order Specific Question:   Supervising Provider    Answer:   Joya Gaskins, PATRICK E [[id number]]  . hydrOXYzine (ATARAX/VISTARIL) 10 MG tablet    Sig: Take 1 tablet (10 mg total) by mouth 3 (three) times daily as needed.    Dispense:  30 tablet    Refill:  0    Order Specific Question:   Supervising Provider    Answer:   Joya Gaskins, PATRICK E [[id number]]  . blood glucose meter kit and supplies    Sig: Dispense based on patient and insurance preference. Use up to four times daily as directed. (FOR ICD-10 E10.9, E11.9).    Dispense:  1 each    Refill:  0    Order Specific Question:   Supervising Provider    Answer:   Noralyn Pick    Order Specific Question:   Number of strips    Answer:   100    Order Specific Question:   Number of lancets    Answer:   100   I have reviewed the patient's medical history (PMH,  PSH, Social History, Family History, Medications, and allergies) , and have been updated if relevant. I spent 30 minutes reviewing chart and  face to face time with patient.     Follow-up: Return in about 4 weeks (around 11/22/2019) for follow up with [name] , NP.    Loraine Grip Tamber Burtch, PA-C

## 2019-10-26 MED FILL — TRUEplus LANCETS 28G MISC: 25 days supply | Qty: 100 | Fill #0

## 2019-10-26 MED FILL — TRUE METRIX TEST STRIP: 25 days supply | Qty: 100 | Fill #0

## 2019-10-26 MED FILL — !TRUE METRIX BLOOD GLUCOSE: 365 days supply | Qty: 1 | Fill #0

## 2019-11-02 ENCOUNTER — Ambulatory Visit: Payer: Self-pay | Attending: Nurse Practitioner

## 2019-11-02 ENCOUNTER — Other Ambulatory Visit: Payer: Self-pay

## 2019-11-22 ENCOUNTER — Other Ambulatory Visit: Payer: Self-pay

## 2019-11-22 ENCOUNTER — Ambulatory Visit: Payer: Self-pay | Attending: Nurse Practitioner | Admitting: Nurse Practitioner

## 2019-12-07 ENCOUNTER — Other Ambulatory Visit: Payer: Self-pay | Admitting: Nurse Practitioner

## 2019-12-07 ENCOUNTER — Encounter: Payer: Self-pay | Admitting: Nurse Practitioner

## 2019-12-07 ENCOUNTER — Other Ambulatory Visit: Payer: Self-pay

## 2019-12-07 ENCOUNTER — Ambulatory Visit: Payer: Self-pay | Attending: Nurse Practitioner | Admitting: Nurse Practitioner

## 2019-12-07 DIAGNOSIS — K219 Gastro-esophageal reflux disease without esophagitis: Secondary | ICD-10-CM

## 2019-12-07 DIAGNOSIS — R7303 Prediabetes: Secondary | ICD-10-CM

## 2019-12-07 DIAGNOSIS — I10 Essential (primary) hypertension: Secondary | ICD-10-CM

## 2019-12-07 DIAGNOSIS — F32A Depression, unspecified: Secondary | ICD-10-CM

## 2019-12-07 DIAGNOSIS — F329 Major depressive disorder, single episode, unspecified: Secondary | ICD-10-CM

## 2019-12-07 DIAGNOSIS — F419 Anxiety disorder, unspecified: Secondary | ICD-10-CM

## 2019-12-07 MED ORDER — PANTOPRAZOLE SODIUM 20 MG PO TBEC: 20.0000 mg | DELAYED_RELEASE_TABLET | Freq: Every day | ORAL | 1 refills | Status: DC

## 2019-12-07 MED ORDER — HYDROXYZINE HCL 10 MG PO TABS: 10.0000 mg | ORAL_TABLET | Freq: Three times a day (TID) | ORAL | 1 refills | Status: DC | PRN

## 2019-12-07 MED ORDER — LISINOPRIL 10 MG PO TABS: 10.0000 mg | ORAL_TABLET | Freq: Every day | ORAL | 1 refills | Status: DC

## 2019-12-07 MED ORDER — FLUOXETINE HCL 20 MG PO TABS: 20.0000 mg | ORAL_TABLET | Freq: Every day | ORAL | 1 refills | Status: DC

## 2019-12-07 MED ORDER — AMLODIPINE BESYLATE 10 MG PO TABS: 10.0000 mg | ORAL_TABLET | Freq: Every day | ORAL | 1 refills | Status: DC

## 2019-12-07 MED FILL — LISINOPRIL 10 MG TABS: 10 | 30 days supply | Qty: 30 | Fill #0

## 2019-12-07 MED FILL — FLUoxetine HCL 20 MG TABS: 20 | 30 days supply | Qty: 30 | Fill #0

## 2019-12-07 MED FILL — AMLODIPINE BESYLATE 10 MG T: 10 | 30 days supply | Qty: 30 | Fill #0

## 2019-12-07 MED FILL — PANTOPRAZOLE SOD DR 20 MG T: 20 | 30 days supply | Qty: 30 | Fill #0

## 2019-12-07 MED FILL — hydrOXYzine HCL 10 MG TABS: 10 | 20 days supply | Qty: 60 | Fill #0

## 2019-12-07 NOTE — Progress Notes (Signed)
Virtual Visit via Telephone Note Due to national recommendations of social distancing due to [place] 19, telehealth visit is felt to be most appropriate for this patient at this time.  I discussed the limitations, risks, security and privacy concerns of performing an evaluation and management service by telephone and the availability of in person appointments. I also discussed with the patient that there may be a patient responsible charge related to this service. The patient expressed understanding and agreed to proceed.    I connected with Stephen Oneal on 12/07/19  at   3:30 PM EDT  EDT by telephone and verified that I am speaking with the correct person using two identifiers.   Consent I discussed the limitations, risks, security and privacy concerns of performing an evaluation and management service by telephone and the availability of in person appointments. I also discussed with the patient that there may be a patient responsible charge related to this service. The patient expressed understanding and agreed to proceed.   Location of Patient: Private Residence   Location of Provider: [place] [facility] [employer] Office    Persons participating in Telemedicine visit: Stephen Rankins FNP-BC [place] Interpreter ID# [phone]   History of Present Illness: Telemedicine visit for:  Anxiety and Depression  His 92 year old son passed one month ago and patient is currently out of work due to grief. Had stopped taking his medications for a while after his son passed but recently restarted a few days ago. Current medications include hydroxyzine 10 mg TID, prozac 20 mg daily. He and his wife are currently receiving grief counseling. He denies any current thoughts of self harm.      Past Medical History:  Diagnosis Date  . Anxiety   . Hypertension   . Prediabetes     History reviewed. No pertinent surgical history.  Family History   Problem Relation Age of Onset  . Diabetes Neg Hx   . Hypertension Neg Hx     Social History   Socioeconomic History  . Marital status: Married    Spouse name: Not on file  . Number of children: Not on file  . Years of education: Not on file  . Highest education level: Not on file  Occupational History  . Not on file  Tobacco Use  . Smoking status: Former Research scientist (life sciences)  . Smokeless tobacco: Never Used  Substance and Sexual Activity  . Alcohol use: Yes    Alcohol/week: 2.0 standard drinks    Types: 2 Cans of beer per week    Comment: daily  . Drug use: No  . Sexual activity: Yes  Other Topics Concern  . Not on file  Social History Narrative  . Not on file   Social Determinants of Health   Financial Resource Strain:   . Difficulty of Paying Living Expenses:   Food Insecurity:   . Worried About Charity fundraiser in the Last Year:   . Arboriculturist in the Last Year:   Transportation Needs:   . Film/video editor (Medical):   Marland Kitchen Lack of Transportation (Non-Medical):   Physical Activity:   . Days of Exercise per Week:   . Minutes of Exercise per Session:   Stress:   . Feeling of Stress :   Social Connections:   . Frequency of Communication with Friends and Family:   . Frequency of Social Gatherings with Friends and Family:   . Attends Religious Services:   .  Active Member of Clubs or Organizations:   . Attends Banker Meetings:   Marland Kitchen Marital Status:      Observations/Objective: Awake, alert and oriented x 3   Review of Systems  Constitutional: Negative for fever, malaise/fatigue and weight loss.  HENT: Negative.  Negative for nosebleeds.   Eyes: Negative.  Negative for blurred vision, double vision and photophobia.  Respiratory: Negative.  Negative for cough and shortness of breath.   Cardiovascular: Negative.  Negative for chest pain, palpitations and leg swelling.  Gastrointestinal: Negative.  Negative for heartburn, nausea and vomiting.   Musculoskeletal: Negative.  Negative for myalgias.  Neurological: Negative.  Negative for dizziness, focal weakness, seizures and headaches.  Psychiatric/Behavioral: Positive for depression. Negative for hallucinations, memory loss, substance abuse and suicidal ideas. The patient is nervous/anxious and has insomnia.     Assessment and Plan: Stephen Oneal was seen today for follow-up.  Diagnoses and all orders for this visit:  Essential hypertension -     lisinopril (ZESTRIL) 10 MG tablet; Take 1 tablet (10 mg total) by mouth daily. -     amLODipine (NORVASC) 10 MG tablet; Take 1 tablet (10 mg total) by mouth daily. Continue all antihypertensives as prescribed.  Remember to bring in your blood pressure log with you for your follow up appointment.  DASH/Mediterranean Diets are healthier choices for HTN.    Gastroesophageal reflux disease without esophagitis -     pantoprazole (PROTONIX) 20 MG tablet; Take 1 tablet (20 mg total) by mouth daily. INSTRUCTIONS: Avoid GERD Triggers: acidic, spicy or fried foods, caffeine, coffee, sodas,  alcohol and chocolate.   Anxiety and depression -     hydrOXYzine (ATARAX/VISTARIL) 10 MG tablet; Take 1 tablet (10 mg total) by mouth 3 (three) times daily as needed. For ANXIETY -     FLUoxetine (PROZAC) 20 MG tablet; Take 1 tablet (20 mg total) by mouth daily.     Follow Up Instructions Return in about 8 weeks (around 02/01/2020).     I discussed the assessment and treatment plan with the patient. The patient was provided an opportunity to ask questions and all were answered. The patient agreed with the plan and demonstrated an understanding of the instructions.   The patient was advised to call back or seek an in-person evaluation if the symptoms worsen or if the condition fails to improve as anticipated.  I provided 17 minutes of non-face-to-face time during this encounter including median intraservice time, reviewing previous notes, labs, imaging,  medications and explaining diagnosis and management.  Claiborne Rigg, FNP-BC

## 2019-12-10 ENCOUNTER — Encounter (HOSPITAL_COMMUNITY): Payer: Self-pay | Admitting: Emergency Medicine

## 2019-12-10 ENCOUNTER — Other Ambulatory Visit: Payer: Self-pay

## 2019-12-10 ENCOUNTER — Emergency Department (HOSPITAL_COMMUNITY)
Admission: EM | Admit: 2019-12-10 | Discharge: 2019-12-10 | Disposition: A | Discharge status: Home / Self Care | Payer: Self-pay | Attending: Emergency Medicine | Admitting: Emergency Medicine

## 2019-12-10 ENCOUNTER — Emergency Department (HOSPITAL_COMMUNITY): Payer: Self-pay

## 2019-12-10 DIAGNOSIS — F32A Depression, unspecified: Secondary | ICD-10-CM

## 2019-12-10 DIAGNOSIS — F419 Anxiety disorder, unspecified: Secondary | ICD-10-CM

## 2019-12-10 DIAGNOSIS — Z79899 Other long term (current) drug therapy: Secondary | ICD-10-CM | POA: Insufficient documentation

## 2019-12-10 DIAGNOSIS — I10 Essential (primary) hypertension: Secondary | ICD-10-CM | POA: Insufficient documentation

## 2019-12-10 DIAGNOSIS — K2920 Alcoholic gastritis without bleeding: Secondary | ICD-10-CM

## 2019-12-10 DIAGNOSIS — Z87891 Personal history of nicotine dependence: Secondary | ICD-10-CM | POA: Insufficient documentation

## 2019-12-10 LAB — URINALYSIS, ROUTINE W REFLEX MICROSCOPIC
Bacteria, UA: NONE SEEN
Bilirubin Urine: NEGATIVE
Glucose, UA: 50 mg/dL — AB
Hgb urine dipstick: NEGATIVE
Ketones, ur: NEGATIVE mg/dL
Leukocytes,Ua: NEGATIVE
Nitrite: NEGATIVE
Protein, ur: NEGATIVE mg/dL
Specific Gravity, Urine: 1.009 (ref 1.005–1.030)
pH: 6 (ref 5.0–8.0)

## 2019-12-10 LAB — COMPREHENSIVE METABOLIC PANEL
ALT: 65 U/L — ABNORMAL HIGH (ref 0–44)
AST: 86 U/L — ABNORMAL HIGH (ref 15–41)
Albumin: 4.4 g/dL (ref 3.5–5.0)
Alkaline Phosphatase: 105 U/L (ref 38–126)
Anion gap: 12 (ref 5–15)
BUN: 5 mg/dL — ABNORMAL LOW (ref 6–20)
CO2: 27 mmol/L (ref 22–32)
Calcium: 9.1 mg/dL (ref 8.9–10.3)
Chloride: 100 mmol/L (ref 98–111)
Creatinine, Ser: 0.78 mg/dL (ref 0.61–1.24)
GFR calc Af Amer: >60 mL/min (ref >60)
GFR calc non Af Amer: >60 mL/min (ref >60)
Glucose, Bld: 153 mg/dL — ABNORMAL HIGH (ref 70–99)
Potassium: 3.6 mmol/L (ref 3.5–5.1)
Sodium: 139 mmol/L (ref 135–145)
Total Bilirubin: 1.2 mg/dL (ref 0.3–1.2)
Total Protein: 7.7 g/dL (ref 6.5–8.1)

## 2019-12-10 LAB — CBC
HCT: 45 % (ref 39.0–52.0)
Hemoglobin: 15.2 g/dL (ref 13.0–17.0)
MCH: 32 pg (ref 26.0–34.0)
MCHC: 33.8 g/dL (ref 30.0–36.0)
MCV: 94.7 fL (ref 80.0–100.0)
Platelets: 165 10*3/uL (ref 150–400)
RBC: 4.75 MIL/uL (ref 4.22–5.81)
RDW: 12.4 % (ref 11.5–15.5)
WBC: 9.5 10*3/uL (ref 4.0–10.5)
nRBC: 0 % (ref 0.0–0.2)

## 2019-12-10 LAB — LIPASE, BLOOD: Lipase: 38 U/L (ref 11–51)

## 2019-12-10 MED ORDER — FLUOXETINE HCL 20 MG PO TABS: 20.0000 mg | ORAL_TABLET | Freq: Every day | ORAL | 1 refills | Status: DC

## 2019-12-10 MED ORDER — OMEPRAZOLE 20 MG PO CPDR: 20.0000 mg | DELAYED_RELEASE_CAPSULE | Freq: Every day | ORAL | 0 refills | Status: DC

## 2019-12-10 MED ORDER — ALUM & MAG HYDROXIDE-SIMETH 200-200-20 MG/5ML PO SUSP
30.0000 mL | Freq: Once | ORAL | Status: AC
  Administered 2019-12-10: 30 mL via ORAL
  Filled 2019-12-10: qty 30

## 2019-12-10 MED ORDER — METOCLOPRAMIDE HCL 10 MG PO TABS
10.0000 mg | ORAL_TABLET | Freq: Once | ORAL | Status: AC
  Administered 2019-12-10: 10 mg via ORAL
  Filled 2019-12-10: qty 1

## 2019-12-10 MED ORDER — DICYCLOMINE HCL 10 MG PO CAPS
10.0000 mg | ORAL_CAPSULE | Freq: Once | ORAL | Status: AC
  Administered 2019-12-10: 10 mg via ORAL
  Filled 2019-12-10: qty 1

## 2019-12-10 MED ORDER — LIDOCAINE VISCOUS HCL 2 % MT SOLN
15.0000 mL | Freq: Once | OROMUCOSAL | Status: AC
  Administered 2019-12-10: 15 mL via ORAL
  Filled 2019-12-10: qty 15

## 2019-12-10 MED ORDER — SODIUM CHLORIDE 0.9 % IV BOLUS
1000.0000 mL | Freq: Once | INTRAVENOUS | Status: AC
  Administered 2019-12-10: 1000 mL via INTRAVENOUS

## 2019-12-10 MED ORDER — SUCRALFATE 1 G PO TABS: 1.0000 g | ORAL_TABLET | Freq: Three times a day (TID) | ORAL | 0 refills | Status: DC

## 2019-12-10 MED ORDER — SODIUM CHLORIDE 0.9% FLUSH: 3.0000 mL | Freq: Once | INTRAVENOUS | Status: DC

## 2019-12-10 MED ORDER — ONDANSETRON HCL 4 MG PO TABS: 4.0000 mg | ORAL_TABLET | Freq: Four times a day (QID) | ORAL | 0 refills | Status: DC

## 2019-12-10 NOTE — ED Provider Notes (Addendum)
[facility] EMERGENCY DEPARTMENT Provider Note   CSN: [id number] Arrival date & time: 12/10/19  0938     History Chief Complaint  Patient presents with  . Abdominal Pain  . Emesis    Jereld Presti is a 44 y.o. male.  HPI 44 year old male with a history of essential hypertension, gastritis, anxiety and depression presents to the ER for a 8-day history of nausea and mid epigastric pain for the last 8 days.  History provided via telemetry interpreter.  Patient reports waxing and waning nausea over the last 8 days, has not been able to eat or drink much due to this.  He reports that his son died approximately 1 month ago and he has been drinking every day since then.  Reports drinking 6 large beers a day.  He denies any vomiting, headache, chest pain, shortness of breath, dysuria, hematuria, diarrhea, constipation, black tarry stools, hematochezia, lightheadedness, syncope, palpitations, diaphoresis.  His wife at bedside reports that he has been having intermittent coughing spells over the last 2 months.  She also states that he is constantly grabbing his midepigastric area and complaining of pain to the area.  Patient is followed by [facility], has been prescribed Protonix, patient reports that he has been but has not been very successful.  Patient also thinks that his symptoms do to be secondary to his depression and anxiety in relation to his son's passing.  He also reports that he got his second Covid vaccination yesterday.    Past Medical History:  Diagnosis Date  . Anxiety   . Hypertension   . Prediabetes     Patient Active Problem List   Diagnosis Date Noted  . Essential hypertension 09/29/2018  . Prediabetes 09/29/2018    History reviewed. No pertinent surgical history.     Family History  Problem Relation Age of Onset  . Diabetes Neg Hx   . Hypertension Neg Hx     Social History   Tobacco Use  . Smoking status: Former Research scientist (life sciences)   . Smokeless tobacco: Never Used  Substance Use Topics  . Alcohol use: Yes    Alcohol/week: 2.0 standard drinks    Types: 2 Cans of beer per week    Comment: daily  . Drug use: No    Home Medications Prior to Admission medications   Medication Sig Start Date End Date Taking? Authorizing Provider  amLODipine (NORVASC) 10 MG tablet Take 1 tablet (10 mg total) by mouth daily. 12/07/19 03/06/20 Yes Gildardo Pounds, NP  FLUoxetine (PROZAC) 20 MG tablet Take 1 tablet (20 mg total) by mouth daily. 12/07/19 03/06/20 Yes Gildardo Pounds, NP  hydrOXYzine (ATARAX/VISTARIL) 10 MG tablet Take 1 tablet (10 mg total) by mouth 3 (three) times daily as needed. For ANXIETY Patient taking differently: Take 10 mg by mouth 3 (three) times daily as needed for anxiety.  12/07/19  Yes Gildardo Pounds, NP  lisinopril (ZESTRIL) 10 MG tablet Take 1 tablet (10 mg total) by mouth daily. 12/07/19  Yes Gildardo Pounds, NP  pantoprazole (PROTONIX) 20 MG tablet Take 1 tablet (20 mg total) by mouth daily. 12/07/19 01/06/20 Yes Gildardo Pounds, NP  blood glucose meter kit and supplies Dispense based on patient and insurance preference. Use up to four times daily as directed. (FOR ICD-10 E10.9, E11.9). 10/25/19   Mayers, Cari S, PA-C  omeprazole (PRILOSEC) 20 MG capsule Take 1 capsule (20 mg total) by mouth daily. 12/10/19   Garald Balding, PA-C  ondansetron (ZOFRAN) 4 MG tablet Take 1 tablet (4 mg total) by mouth every 6 (six) hours. 12/10/19   Garald Balding, PA-C  sucralfate (CARAFATE) 1 g tablet Take 1 tablet (1 g total) by mouth 4 (four) times daily -  with meals and at bedtime. 12/10/19   Garald Balding, PA-C    Allergies    Fish allergy and Shrimp [shellfish allergy]  Review of Systems   Review of Systems  Constitutional: Negative for chills, diaphoresis and fever.  Respiratory: Positive for cough. Negative for chest tightness and shortness of breath.   Cardiovascular: Negative for chest pain, palpitations and leg swelling.    Gastrointestinal: Positive for abdominal pain and nausea. Negative for abdominal distention, blood in stool, constipation and vomiting.  Skin: Negative for rash.  Neurological: Negative for dizziness, syncope, weakness, light-headedness and headaches.  Psychiatric/Behavioral: Negative for self-injury and suicidal ideas.  All other systems reviewed and are negative.   Physical Exam Updated Vital Signs BP (!) 144/88   Pulse 86   Temp 98.4 F (36.9 C) (Oral)   Resp 11   SpO2 97%   Physical Exam Vitals and nursing note reviewed.  Constitutional:      General: He is not in acute distress.    Appearance: He is well-developed and normal weight. He is not ill-appearing or toxic-appearing.  HENT:     Head: Normocephalic and atraumatic.  Eyes:     Conjunctiva/sclera: Conjunctivae normal.  Cardiovascular:     Rate and Rhythm: Normal rate and regular rhythm.     Heart sounds: Normal heart sounds. No murmur.  Pulmonary:     Effort: Pulmonary effort is normal. No respiratory distress.     Breath sounds: Normal breath sounds.  Abdominal:     General: Abdomen is flat. Bowel sounds are normal. There is no distension.     Palpations: Abdomen is soft.     Tenderness: There is abdominal tenderness in the epigastric area. There is no right CVA tenderness, left CVA tenderness, guarding or rebound. Negative signs include Murphy's sign, Rovsing's sign and McBurney's sign.  Musculoskeletal:     Cervical back: Neck supple.  Skin:    General: Skin is warm and dry.     Capillary Refill: Capillary refill takes less than 2 seconds.     Coloration: Skin is not cyanotic or jaundiced.  Neurological:     General: No focal deficit present.     Mental Status: He is alert and oriented to person, place, and time.  Psychiatric:        Mood and Affect: Mood normal.        Behavior: Behavior normal.     ED Results / Procedures / Treatments   Labs (all labs ordered are listed, but only abnormal results  are displayed) Labs Reviewed  COMPREHENSIVE METABOLIC PANEL - Abnormal; Notable for the following components:      Result Value   Glucose, Bld 153 (*)    BUN 5 (*)    AST 86 (*)    ALT 65 (*)    All other components within normal limits  URINALYSIS, ROUTINE W REFLEX MICROSCOPIC - Abnormal; Notable for the following components:   Color, Urine STRAW (*)    Glucose, UA 50 (*)    All other components within normal limits  LIPASE, BLOOD  CBC    EKG EKG Interpretation  Date/Time:  Saturday Dec 10 2019 11:36:16 EDT Ventricular Rate:  86 PR Interval:    QRS Duration: 81 QT  Interval:  368 QTC Calculation: 441 R Axis:   22 Text Interpretation: Sinus rhythm No significant change since last tracing Confirmed by Dorie Rank [phone]) on 12/10/2019 11:57:25 AM   Radiology DG Chest Portable 1 View  Result Date: 12/10/2019 CLINICAL DATA:  Triage notes; Pt reports abd pain and vomiting x8 days, denies fevers or diarrhea. Reports he got his second covid shot yesterday. Also endorses some shaking in both hands. EXAM: PORTABLE CHEST - 1 VIEW COMPARISON:  06/15/2019 FINDINGS: Coarse interstitial markings, stable.  No new infiltrate or edema. Heart size and mediastinal contours are within normal limits. No effusion. Visualized bones unremarkable. IMPRESSION: No acute cardiopulmonary disease. Electronically Signed   By: Lucrezia Europe M.D.   On: 12/10/2019 11:45    Procedures Procedures (including critical care time)  Medications Ordered in ED Medications  sodium chloride flush (NS) 0.9 % injection 3 mL (3 mLs Intravenous Not Given 12/10/19 1031)  dicyclomine (BENTYL) capsule 10 mg (10 mg Oral Given 12/10/19 1159)  alum & mag hydroxide-simeth (MAALOX/MYLANTA) 200-200-20 MG/5ML suspension 30 mL (30 mLs Oral Given 12/10/19 1158)    And  lidocaine (XYLOCAINE) 2 % viscous mouth solution 15 mL (15 mLs Oral Given 12/10/19 1158)  metoCLOPramide (REGLAN) tablet 10 mg (10 mg Oral Given 12/10/19 1159)  sodium chloride 0.9  % bolus 1,000 mL (1,000 mLs Intravenous New Bag/Given 12/10/19 1253)    ED Course  I have reviewed the triage vital signs and the nursing notes.  Pertinent labs & imaging results that were available during my care of the patient were reviewed by me and considered in my medical decision making (see chart for details).    MDM Rules/Calculators/A&P                     44 year old male with nausea and epigastric pain x8 days. On presentation, patient is alert and oriented, resting comfortably in the ER bed, nontoxic-appearing without increased work of breathing.  Not actively vomiting in the ER.  Physical exam positive for mild midepigastric tenderness to palpation but otherwise benign.  Vitals overall reassuring, the patient is hypertensive which seems to be consistent with his previous presentations.    Differential diagnosis of epigastric pain includes: Functional or nonulcer dyspepsia (MCC), PUD, GERD, Gastritis, (NSAIDs, alcohol, stress, H. pylori, pernicious anemia), pancreatitis or pancreatic cancer, overeating indigestion (high-fat foods, coffee), drugs (aspirin, antibiotics (eg, macrolides, metronidazole), corticosteroids, digoxin, narcotics, theophylline), gastroparesis, lactose intolerance, malabsorption gastric cancer, parasitic infection, (Giardia, Strongyloides, Ascaris) cholelithiasis, choledocholithiasis, or cholangitis, ACS, pericarditis, pneumonia, abdominal hernia, pregnancy, intestinal ischemia, esophageal rupture, gastric volvulus, hepatitis.  CBC without leukocytosis, normal hemoglobin, CMP without significant electrolyte normalities, mildly elevated glucose and slightly decreased BUN but normal creatinine.  His AST and ALT are elevated at 86 and 65 respectively, there are is no previous lab work to compare this to.  Normal bilirubin.  Lipase normal.  Chest x-ray without acute abnormalities.  EKG normal sinus rhythm.  Patient received 1 L fluid bolus, GI cocktail, Bentyl, Reglan.   On reexamination, patient noted significant improvement.    Patient requesting go home.  Discussed at length alcohol cessation, likely contributing to his gastritis symptoms.  We also discussed his elevated liver enzymes and his need to follow-up with his PCP.  I also provided a GI referral, I spoke with the patient and his wife and stressed making an appointment.  I also messaged his NP [name] to inform her of his ER visit and need for followup.  Will  DC with Zofran, switch pantoprazole to omeprazole, and add Carafate.  Strict return precautions given.  Patient voices understanding is agreeable to this plan.  I discussed the case with the Dr. Tomi Bamberger and he is agreeable to the above plan. Final Clinical Impression(s) / ED Diagnoses Final diagnoses:  Chronic alcoholic gastritis without hemorrhage    Rx / DC Orders ED Discharge Orders         Ordered    ondansetron (ZOFRAN) 4 MG tablet  Every 6 hours     12/10/19 1240    sucralfate (CARAFATE) 1 g tablet  3 times daily with meals & bedtime     12/10/19 1405    omeprazole (PRILOSEC) 20 MG capsule  Daily     12/10/19 1405             Lyndel Safe 12/10/19 1521    Dorie Rank, MD 12/10/19 1614

## 2019-12-10 NOTE — Discharge Instructions (Addendum)
Please make sure to follow up with your family doctor at [facility]. Your liver function tests were elevated and you will need to follow up with your family doctor and a  stomach doctor. I have provided the referral number to a stomach doctor as well, please call this number to schedule an appointment. I have provided some nausea medications for your symptoms, and provided two new medications. We have switched your pantoprazole to omeprazole to see if that works better for you. I have also prescribed some Carafate which is a medication to help prevent and treat ulcers in the stomach. Take this 3 times a day with meals and at bedtime. Return to the ER if your symptoms worsen.

## 2019-12-10 NOTE — ED Triage Notes (Addendum)
Pt reports abd pain and vomiting x8 days, denies fevers or diarrhea. Reports he got his second covid shot yesterday. Also endorses some shaking in both hands.

## 2019-12-10 NOTE — ED Notes (Signed)
Pt reports central abdominal pain and vomiting for the last 8 days. Pt reports history of gastritis as well. Pt also complains of anxiety and shaking of his hands. The pt says his 46-yr old son passed away last month and he has been drinking everyday since, his last drink was yesterday, he states he had about 4 beers.

## 2019-12-12 ENCOUNTER — Other Ambulatory Visit: Payer: Self-pay | Admitting: Nurse Practitioner

## 2019-12-12 DIAGNOSIS — R7401 Elevation of levels of liver transaminase levels: Secondary | ICD-10-CM

## 2019-12-15 ENCOUNTER — Other Ambulatory Visit: Payer: Self-pay

## 2019-12-15 ENCOUNTER — Encounter: Payer: Self-pay | Admitting: Nurse Practitioner

## 2019-12-15 ENCOUNTER — Ambulatory Visit: Payer: Self-pay | Attending: Nurse Practitioner

## 2019-12-15 DIAGNOSIS — I10 Essential (primary) hypertension: Secondary | ICD-10-CM

## 2019-12-15 DIAGNOSIS — R7303 Prediabetes: Secondary | ICD-10-CM

## 2019-12-15 DIAGNOSIS — K219 Gastro-esophageal reflux disease without esophagitis: Secondary | ICD-10-CM

## 2019-12-16 LAB — CBC
Hematocrit: 43 % (ref 37.5–51.0)
Hemoglobin: 15.2 g/dL (ref 13.0–17.7)
MCH: 32.5 pg (ref 26.6–33.0)
MCHC: 35.3 g/dL (ref 31.5–35.7)
MCV: 92 fL (ref 79–97)
Platelets: 216 10*3/uL (ref 150–450)
RBC: 4.67 x10E6/uL (ref 4.14–5.80)
RDW: 12.3 % (ref 11.6–15.4)
WBC: 6.6 10*3/uL (ref 3.4–10.8)

## 2019-12-16 LAB — CMP14+EGFR
ALT: 103 IU/L — ABNORMAL HIGH (ref 0–44)
AST: 84 IU/L — ABNORMAL HIGH (ref 0–40)
Albumin/Globulin Ratio: 1.8 (ref 1.2–2.2)
Albumin: 4.7 g/dL (ref 4.0–5.0)
Alkaline Phosphatase: 135 IU/L — ABNORMAL HIGH (ref 39–117)
BUN/Creatinine Ratio: 12 (ref 9–20)
BUN: 9 mg/dL (ref 6–24)
Bilirubin Total: 0.6 mg/dL (ref 0.0–1.2)
CO2: 26 mmol/L (ref 20–29)
Calcium: 9.5 mg/dL (ref 8.7–10.2)
Chloride: 104 mmol/L (ref 96–106)
Creatinine, Ser: 0.78 mg/dL (ref 0.76–1.27)
GFR calc Af Amer: 125 mL/min/{1.73_m2} (ref >59)
GFR calc non Af Amer: 108 mL/min/{1.73_m2} (ref >59)
Globulin, Total: 2.6 g/dL (ref 1.5–4.5)
Glucose: 87 mg/dL (ref 65–99)
Potassium: 3.4 mmol/L — ABNORMAL LOW (ref 3.5–5.2)
Sodium: 143 mmol/L (ref 134–144)
Total Protein: 7.3 g/dL (ref 6.0–8.5)

## 2019-12-16 LAB — LIPID PANEL
Chol/HDL Ratio: 2.6 ratio (ref 0.0–5.0)
Cholesterol, Total: 156 mg/dL (ref 100–199)
HDL: 59 mg/dL (ref >39)
LDL Chol Calc (NIH): 82 mg/dL (ref 0–99)
Triglycerides: 81 mg/dL (ref 0–149)
VLDL Cholesterol Cal: 15 mg/dL (ref 5–40)

## 2019-12-17 ENCOUNTER — Other Ambulatory Visit: Payer: Self-pay | Admitting: Nurse Practitioner

## 2019-12-17 DIAGNOSIS — R7401 Elevation of levels of liver transaminase levels: Secondary | ICD-10-CM

## 2020-01-04 ENCOUNTER — Encounter: Payer: Self-pay | Admitting: Nurse Practitioner

## 2020-01-04 ENCOUNTER — Ambulatory Visit (INDEPENDENT_AMBULATORY_CARE_PROVIDER_SITE_OTHER): Payer: Self-pay | Admitting: Nurse Practitioner

## 2020-01-04 VITALS — BP 116/70 | HR 72 | Ht 61.75 in | Wt 132.1 lb

## 2020-01-04 DIAGNOSIS — R1013 Epigastric pain: Secondary | ICD-10-CM

## 2020-01-04 DIAGNOSIS — R7989 Other specified abnormal findings of blood chemistry: Secondary | ICD-10-CM

## 2020-01-04 DIAGNOSIS — F1010 Alcohol abuse, uncomplicated: Secondary | ICD-10-CM

## 2020-01-04 DIAGNOSIS — R110 Nausea: Secondary | ICD-10-CM

## 2020-01-04 NOTE — Progress Notes (Signed)
01/04/2020 Stephen Oneal [id number] 15-Sep-1975   CHIEF COMPLAINT: Epigastric pain and elevated liver enzymes  HISTORY OF PRESENT ILLNESS:  Stephen Oneal is a 44 year old male with a past medical history of anxiety, depression, hypertension, prediabetes, GERD and + Covid 19 07/2019.  Presents today accompanied by his wife and a [place] interpreter.  He speaks [place]. He  presented to [facility] ED on 12/10/2019 with nausea and epigastric pain.  He reported drinking excessive amounts of alcohol after his 22 year old son died by suicide 4 weeks ago.  He previously drank 5 (22 oz) beers daily for the past year.  After his son died, he reported drinking 15 (12) ounce beers daily x 4 weeks. His food intake was significantly reduced as well.  In the ED, his LFTs were elevated.  Alk phos 135.  AST 84.  ALT 103.  Normal total bilirubin level 0.6.  WBC 6.6.  Hemoglobin 15.2.  Hematocrit 43.0.  Abdominal imaging was not completed. He is uninsured. He was prescribed Omeprazole 20 mg daily, Carafate 1 g p.o. 4 times daily and ondansetron 4 mg every 6 hours as needed.  Following his ER visit, he has reduced his beer intake significantly.  He is drinking 4 beers weekly with the goal of no alcohol.  He is eating 2-3 meals consistently.  He denies having any further nausea or upper abdominal pain.  He denies having any dysphagia or heartburn.  No lower abdominal pain.  He is passing a normal brown bowel movement once daily.  No rectal bleeding or melena.  No known family history of esophageal, gastric or colon cancer.  He stated he is scheduled to have a repeat blood test to check his liver function tomorrow as ordered by his PCP.  He complains of burning with urination with skin irritation to his penis.  I advised the patient to follow-up with his PCP tomorrow regarding these symptoms.  He received his second Covid vaccination on 12/09/2019.   CBC Latest Ref Rng & Units 12/15/2019 12/10/2019 07/15/2019   WBC 3.4 - 10.8 x10E3/uL 6.6 9.5 8.2  Hemoglobin 13.0 - 17.7 g/dL 15.2 15.2 16.2  Hematocrit 37.5 - 51.0 % 43.0 45.0 46.0  Platelets 150 - 450 x10E3/uL 216 165 228   CMP Latest Ref Rng & Units 12/15/2019 12/10/2019 07/15/2019  Glucose 65 - 99 mg/dL 87 153(H) 112(H)  BUN 6 - 24 mg/dL 9 5(L) 5(L)  Creatinine 0.76 - 1.27 mg/dL 0.78 0.78 0.70  Sodium 134 - 144 mmol/L 143 139 139  Potassium 3.5 - 5.2 mmol/L 3.4(L) 3.6 3.4(L)  Chloride 96 - 106 mmol/L 104 100 100  CO2 20 - 29 mmol/L '26 27 27  ' Calcium 8.7 - 10.2 mg/dL 9.5 9.1 9.4  Total Protein 6.0 - 8.5 g/dL 7.3 7.7 -  Total Bilirubin 0.0 - 1.2 mg/dL 0.6 1.2 -  Alkaline Phos 39 - 117 IU/L 135(H) 105 -  AST 0 - 40 IU/L 84(H) 86(H) -  ALT 0 - 44 IU/L 103(H) 65(H) -    Past Medical History:  Diagnosis Date  . Alcohol abuse   . Anxiety   . Depression   . GERD (gastroesophageal reflux disease)   . Hypertension   . Prediabetes    Past Surgical History:  Procedure Laterality Date  . NO PAST SURGERIES       Social History:  Married. Son died from suicide 9. Son 9. Nonsmoker. Alcohol intake - see HPI. No drug use.   Family History:  Father 105. Mother died age 61 years old during childbirth. Two sisters and one brother alive and well.   Allergies  Allergen Reactions  . Fish Allergy   . Shrimp [Shellfish Allergy]       Outpatient Encounter Medications as of 01/04/2020  Medication Sig  . amLODipine (NORVASC) 10 MG tablet Take 1 tablet (10 mg total) by mouth daily.  . blood glucose meter kit and supplies Dispense based on patient and insurance preference. Use up to four times daily as directed. (FOR ICD-10 E10.9, E11.9).  Marland Kitchen FLUoxetine (PROZAC) 20 MG tablet Take 1 tablet (20 mg total) by mouth daily.  . hydrOXYzine (ATARAX/VISTARIL) 10 MG tablet Take 1 tablet (10 mg total) by mouth 3 (three) times daily as needed. For ANXIETY (Patient taking differently: Take 10 mg by mouth 3 (three) times daily as needed for anxiety. )  . lisinopril  (ZESTRIL) 10 MG tablet Take 1 tablet (10 mg total) by mouth daily.  Marland Kitchen omeprazole (PRILOSEC) 20 MG capsule Take 1 capsule (20 mg total) by mouth daily.  . ondansetron (ZOFRAN) 4 MG tablet Take 1 tablet (4 mg total) by mouth every 6 (six) hours.  . sucralfate (CARAFATE) 1 g tablet Take 1 tablet (1 g total) by mouth 4 (four) times daily -  with meals and at bedtime.  . [DISCONTINUED] pantoprazole (PROTONIX) 20 MG tablet Take 1 tablet (20 mg total) by mouth daily.   No facility-administered encounter medications on file as of 01/04/2020.     REVIEW OF SYSTEMS: All other systems reviewed and negative except where noted in the History of Present Illness.  PHYSICAL EXAM: BP 116/70 (BP Location: Left Arm, Patient Position: Sitting, Cuff Size: Normal)   Pulse 72   Ht 5' 1.75" (1.568 m) Comment: height measured without shoes  Wt 132 lb 2 oz (59.9 kg)   BMI 24.36 kg/m  General: 44 year old Hispanic male in no acute distress. Head: Normocephalic and atraumatic. Eyes:  Sclerae non-icteric, conjunctive pink. Ears: Normal auditory acuity. Mouth: Dentition intact. No ulcers or lesions.  Neck: Supple, no lymphadenopathy or thyromegaly.  Lungs: Clear bilaterally to auscultation without wheezes, crackles or rhonchi. Heart: Regular rate and rhythm. No murmur, rub or gallop appreciated.  Abdomen: Soft, nontender, non distended. No masses. No hepatosplenomegaly. Normoactive bowel sounds x 4 quadrants.  Rectal: Deferred.  Musculoskeletal: Symmetrical with no gross deformities. Skin: Warm and dry. No rash or lesions on visible extremities. Extremities: No edema. Neurological: Alert oriented x 4, no focal deficits.  Psychological:  Alert and cooperative. Normal mood and affect.  ASSESSMENT AND PLAN:  38. 44 year old male with nausea and epigastric pain.  -Omeprazole 64m daily -Okay to stop Carafate completing 10 days. -Eventual EGD -GERD/ulcer handout  2. Elevated LFTs in setting of alcohol abuse.    -RUQ Abdominal sonogram to be ordered when the patient has health  insurance, he declines to proceed with an abdominal ultrasound at this time due to out of pocket cost -Repeat hepatic panel ordered by PCP to be completed on 6/3 -Further recommendations to be determined after repeat hepatic panel received -No alcohol  3. Colon cancer screening. -Screening colonoscopy recommended when the patient has health insurance, to further discuss at the time of his next follow-up appointment    CC:  FGildardo Pounds NP

## 2020-01-04 NOTE — Patient Instructions (Addendum)
If you are age 46 or older, your body mass index should be between 23-30. Your Body mass index is 24.36 kg/m. If this is out of the aforementioned range listed, please consider follow up with your Primary Care Provider.  If you are age 61 or younger, your body mass index should be between 19-25. Your Body mass index is 24.36 kg/m. If this is out of the aforementioned range listed, please consider follow up with your Primary Care Provider.   No alcohol consumption.  Continue Omeprazole 20 mg 1 capsule daily.  Call the office if symptoms worsen.  You have been scheduled to follow up with Alcide Evener, NP on February 01, 2020 at 3:30 pm.   Enfermedad de reflujo gastroesofgico en los adultos Gastroesophageal Reflux Disease, Adult El reflujo gastroesofgico (RGE) ocurre cuando el cido del estmago sube por el tubo que conecta la boca con el estmago (esfago). Normalmente, la comida baja por el esfago y se mantiene en el [address], donde se la digiere. Cuando una persona tiene RGE, los alimentos y el cido estomacal suelen volver al esfago. Usted puede tener una enfermedad llamada enfermedad de reflujo gastroesofgico (ERGE) si el reflujo:  Sucede a menudo.  Causa sntomas frecuentes o muy intensos.  Causa problemas tales como dao en el esfago. Cuando esto ocurre, el esfago duele y se hincha (inflama). Con el tiempo, la ERGE puede ocasionar pequeos agujeros (lceras) en el revestimiento del esfago. Cules son las causas? Esta afeccin se debe a un problema en el msculo que se encuentra entre el esfago y [place]. Cuando este msculo est dbil o no es normal, no se cierra correctamente para impedir que los alimentos y el cido regresen del Teaching laboratory technician. El msculo puede debilitarse debido a lo siguiente:  El consumo de [place].  [place].  Tener cierto tipo de hernia (hernia de hiato).  Consumo de alcohol.  Ciertos alimentos y bebidas, como caf, chocolate, cebollas y  [place]. Qu incrementa el riesgo? Es ms probable que tenga esta afeccin si:  Tiene sobrepeso.  Tiene una enfermedad que afecta el tejido conjuntivo.  [place] antiinflamatorios no esteroideos (AINE). Cules son los signos o los sntomas? Los sntomas de esta afeccin incluyen:  Acidez estomacal.  Dificultad o dolor al tragar.  Sensacin de Warehouse manager un bulto en la garganta.  Sabor amargo en la boca.  Mal aliento.  Tener una gran cantidad de saliva.  Estmago inflamado o con Dentist.  Eructos.  Dolor en el pecho. El dolor de pecho puede deberse a distintas afecciones. Asegrese de Science writer a su mdico si tiene Journalist, newspaper.  Falta de aire o respiracin ruidosa (sibilancias).  Tos constante (crnica) o durante la noche.  Desgaste de la superficie de los dientes (esmalte dental).  Prdida de peso. Cmo se trata? El tratamiento depender de la gravedad de los sntomas. El mdico puede sugerirle lo siguiente:  Cambios en la dieta.  Medicamentos.  [place]. Siga estas indicaciones en su casa: Comida y bebida   Siga una dieta como se lo haya indicado el mdico. Es posible que deba evitar alimentos y bebidas, por ejemplo: ? Caf y t (con o sin cafena). ? Bebidas que contengan alcohol. ? Bebidas energticas y deportivas. ? Bebidas gaseosas y refrescos. ? Chocolate y cacao. ? Menta y esencia de [place]. ? Ajo y cebolla. ? Rbano picante. ? Alimentos cidos y condimentados. Estos incluyen todos los tipos de pimientos, [place] en polvo, curry en polvo, vinagre, salsas picantes y [employer]. ? Ctricos y  sus jugos, por ejemplo, naranjas, limones y limas. ? Alimentos que [employer]. Estos incluyen salsa roja, [place], salsa picante y pizza con salsa de [place]. ? Alimentos fritos y Radio broadcast assistant. Estos incluyen donas, papas fritas, papitas fritas de bolsa y aderezos con alto contenido de [place]. ? Carnes con alto contenido de [place]. Estas incluye los perros  calientes, chuletas o costillas, embutidos, jamn y tocino. ? Productos lcteos ricos en grasas, como leche [place], [place] y [place] crema.  Consuma pequeas cantidades de comida con ms frecuencia. Evite consumir porciones abundantes.  Evite beber grandes cantidades de lquidos con las comidas.  Evite comer 2 o 3horas antes de acostarse.  Evite recostarse inmediatamente despus de comer.  No haga ejercicios enseguida despus de comer. Estilo de vida   No consuma ningn producto que contenga nicotina o tabaco. Estos incluyen cigarrillos, cigarrillos electrnicos y tabaco para Higher education careers adviser. Si necesita ayuda para dejar de fumar, consulte al [employer].  Intente reducir [employer] de estrs. Si necesita ayuda para hacer esto, consulte al mdico.  Si tiene sobrepeso, baje una cantidad de peso saludable para usted. Consulte a su mdico para bajar de peso de [employer]. Indicaciones generales  Est atento a cualquier cambio en los sntomas.  Tome los medicamentos de venta libre y los recetados solamente como se lo haya indicado el mdico. No tome aspirina, ibuprofeno ni otros AINE a menos que el mdico lo autorice.  Use ropa holgada. No use nada apretado alrededor de la cintura.  Levante (eleve) la cabecera de la cama aproximadamente 6pulgadas (15cm).  Evite inclinarse si al hacerlo empeoran los sntomas.  Concurra a todas las visitas de seguimiento como se lo haya indicado el mdico. Esto es importante. Comunquese con un mdico si:  Aparecen nuevos sntomas.  Adelgaza y no sabe por qu.  Tiene problemas para tragar o le duele cuando traga.  Tiene sibilancias o tos persistente.  Los sntomas no mejoran con Dispensing optician.  Tiene la voz ronca. Solicite ayuda inmediatamente si:  [employer], el cuello, la [place], los dientes o la espalda.  Se siente transpirado, mareado o tiene una sensacin de desvanecimiento.  Siente falta de aire o Neurosurgeon.  Vomita y el vmito tiene un aspecto similar a la sangre o a los posos de caf.  Pierde el conocimiento (se desmaya).  Las deposiciones (heces) son sanguinolentas o negras.  No puede tragar, beber o comer. Resumen  Si una persona tiene enfermedad de reflujo gastroesofgico (ERGE), los alimentos y el cido estomacal suben al esfago y causan sntomas o problemas tales como dao en el esfago.  El tratamiento depender de la gravedad de los sntomas.  Siga una Lexicographer se lo haya indicado el [place] todos los medicamentos solamente como se lo haya indicado el mdico. Esta informacin no tiene Marine scientist el consejo del mdico. Asegrese de hacerle al mdico cualquier pregunta que tenga. Document Revised: 03/04/2018 Document Reviewed: 03/04/2018 Elsevier Patient Education  [place].

## 2020-01-05 ENCOUNTER — Ambulatory Visit: Payer: Self-pay | Attending: Nurse Practitioner

## 2020-01-05 ENCOUNTER — Other Ambulatory Visit: Payer: Self-pay

## 2020-01-05 DIAGNOSIS — R7401 Elevation of levels of liver transaminase levels: Secondary | ICD-10-CM

## 2020-01-06 LAB — HEPATIC FUNCTION PANEL
ALT: 25 IU/L (ref 0–44)
AST: 28 IU/L (ref 0–40)
Albumin: 4.7 g/dL (ref 4.0–5.0)
Alkaline Phosphatase: 124 IU/L — ABNORMAL HIGH (ref 48–121)
Bilirubin Total: 0.3 mg/dL (ref 0.0–1.2)
Bilirubin, Direct: 0.11 mg/dL (ref 0.00–0.40)
Total Protein: 7.2 g/dL (ref 6.0–8.5)

## 2020-01-06 LAB — HEPATITIS C ANTIBODY: Hep C Virus Ab: <0.1 s/co ratio (ref 0.0–0.9)

## 2020-01-10 NOTE — Progress Notes (Signed)
Agree with the assessment and plan as outlined by Colleen Kennedy-Smith, NP.   Valdez Brannan, DO, FACG [facility]   

## 2020-01-23 ENCOUNTER — Telehealth: Payer: Self-pay | Admitting: Pediatric Intensive Care

## 2020-01-23 NOTE — Telephone Encounter (Signed)
Received call from [place], CHW with [facility] She has been in touch with client and his wife since they witnessed their son's death by self-inflicted gun shot two months ago. She states client has stated he wants to kill himself- cutting and by hanging. Client's wife has been working with [employer] case worker for referral to [facility] for behavioral health appointment. CN advised that client should be evaluated as soon as possible at [facility] behavioral health urgent care. CHW given address and information about service hours. She sates she will call client's wife and advise. Shann Medal RN BSN CNP [phone]

## 2020-02-01 ENCOUNTER — Ambulatory Visit: Payer: Self-pay | Admitting: Nurse Practitioner

## 2020-02-10 ENCOUNTER — Encounter: Payer: Self-pay | Admitting: Nurse Practitioner

## 2020-02-10 ENCOUNTER — Ambulatory Visit: Payer: Self-pay | Attending: Nurse Practitioner | Admitting: Nurse Practitioner

## 2020-02-10 ENCOUNTER — Other Ambulatory Visit: Payer: Self-pay

## 2020-02-10 VITALS — BP 147/78 | HR 87 | Temp 97.7°F | Ht 61.75 in | Wt 134.6 lb

## 2020-02-10 DIAGNOSIS — F419 Anxiety disorder, unspecified: Secondary | ICD-10-CM

## 2020-02-10 DIAGNOSIS — F329 Major depressive disorder, single episode, unspecified: Secondary | ICD-10-CM

## 2020-02-10 DIAGNOSIS — R7303 Prediabetes: Secondary | ICD-10-CM

## 2020-02-10 DIAGNOSIS — I10 Essential (primary) hypertension: Secondary | ICD-10-CM

## 2020-02-10 LAB — GLUCOSE, POCT (MANUAL RESULT ENTRY): POC Glucose: 134 mg/dl — AB (ref 70–99)

## 2020-02-10 NOTE — Progress Notes (Signed)
Assessment & Plan:  Stephen Oneal was seen today for follow-up.  Diagnoses and all orders for this visit:  Essential hypertension Continue amlodipine 10 mg and lisinopril 10 mg daily as prescribed. Remember to bring in your blood pressure log with you for your follow up appointment.  DASH/Mediterranean Diets are healthier choices for HTN.    Anxiety and depression Continue Prozac 20 mg daily and hydroxyzine 10 mg 3 times daily as needed as prescribed.  Prediabetes -     Glucose (CBG)    Patient has been counseled on age-appropriate routine health concerns for screening and prevention. These are reviewed and up-to-date. Referrals have been placed accordingly. Immunizations are up-to-date or declined.    Subjective:   Chief Complaint  Patient presents with  . Follow-up    Pt. is here for blood pressure follow up.    HPI Stephen Oneal 44 y.o. male presents to office today for follow up.   has a past medical history of Alcohol abuse, Anxiety, Depression, GERD (gastroesophageal reflux disease), Hypertension, and Prediabetes.   Essential Hypertension Taking amlodipine 10 mg daily and Lisinopril 31m daily as prescribed.  He does not monitor his blood pressure at home.  Blood pressure is slightly elevated today however he states is coming directly from work to the clinic today.  Denies chest pain, shortness of breath, palpitations, lightheadedness, dizziness, headaches or BLE edema. He does not monitor his blood pressure at home.  BP Readings from Last 3 Encounters:  02/10/20 (!) 147/78  01/04/20 116/70  12/10/19 127/83    Depression and Anxiety Much improved on Prozac and hydroxyzine.  At this time I would not make any changes or adjustments to his medications. [facility] 02/10/2020 12/07/2019 10/25/2019 09/29/2019 05/19/2018  Decreased Interest 0 1 0 0 0  Down, Depressed, Hopeless 0 1 0 0 0  PHQ - 2 Score 0 2 0 0 0  Altered sleeping 0 0 0 0 0  Tired,  decreased energy 0 1 0 0 0  Change in appetite 0 1 2 0 0  Feeling bad or failure about yourself  0 0 0 0 0  Trouble concentrating 0 1 0 0 0  Moving slowly or fidgety/restless 0 0 0 0 0  Suicidal thoughts 0 0 0 0 0  PHQ-9 Score 0 5 2 0 0   GAD 7 : Generalized Anxiety Score 02/10/2020 12/07/2019 10/25/2019 05/19/2018  Nervous, Anxious, on Edge 0 1 0 0  Control/stop worrying 0 1 2 0  Worry too much - different things 0 1 2 0  Trouble relaxing 0 1 0 0  Restless 0 1 0 0  Easily annoyed or irritable 0 0 2 0  Afraid - awful might happen 0 1 0 0  Total GAD 7 Score 0 6 6 0    Review of Systems  Constitutional: Negative for fever, malaise/fatigue and weight loss.  HENT: Negative.  Negative for nosebleeds.   Eyes: Negative.  Negative for blurred vision, double vision and photophobia.  Respiratory: Negative.  Negative for cough and shortness of breath.   Cardiovascular: Negative.  Negative for chest pain, palpitations and leg swelling.  Gastrointestinal: Negative.  Negative for heartburn, nausea and vomiting.  Musculoskeletal: Negative.  Negative for myalgias.  Neurological: Negative.  Negative for dizziness, focal weakness, seizures and headaches.  Psychiatric/Behavioral: Positive for depression. Negative for suicidal ideas. The patient is nervous/anxious.     Past Medical History:  Diagnosis Date  . Alcohol abuse   . Anxiety   .  Depression   . GERD (gastroesophageal reflux disease)   . Hypertension   . Prediabetes     Past Surgical History:  Procedure Laterality Date  . NO PAST SURGERIES      Family History  Problem Relation Age of Onset  . Diabetes Neg Hx   . Hypertension Neg Hx     Social History Reviewed with no changes to be made today.   Outpatient Medications Prior to Visit  Medication Sig Dispense Refill  . amLODipine (NORVASC) 10 MG tablet Take 1 tablet (10 mg total) by mouth daily. 90 tablet 1  . blood glucose meter kit and supplies Dispense based on patient and  insurance preference. Use up to four times daily as directed. (FOR ICD-10 E10.9, E11.9). 1 each 0  . FLUoxetine (PROZAC) 20 MG tablet Take 1 tablet (20 mg total) by mouth daily. 90 tablet 1  . hydrOXYzine (ATARAX/VISTARIL) 10 MG tablet Take 1 tablet (10 mg total) by mouth 3 (three) times daily as needed. For ANXIETY (Patient taking differently: Take 10 mg by mouth 3 (three) times daily as needed for anxiety. ) 60 tablet 1  . lisinopril (ZESTRIL) 10 MG tablet Take 1 tablet (10 mg total) by mouth daily. 90 tablet 1  . omeprazole (PRILOSEC) 20 MG capsule Take 1 capsule (20 mg total) by mouth daily. 30 capsule 0  . ondansetron (ZOFRAN) 4 MG tablet Take 1 tablet (4 mg total) by mouth every 6 (six) hours. 12 tablet 0  . sucralfate (CARAFATE) 1 g tablet Take 1 tablet (1 g total) by mouth 4 (four) times daily -  with meals and at bedtime. 120 tablet 0   No facility-administered medications prior to visit.    Allergies  Allergen Reactions  . Fish Allergy   . Shrimp [Shellfish Allergy]        Objective:    BP (!) 147/78 (BP Location: Right Arm, Patient Position: Sitting, Cuff Size: Normal)   Pulse 87   Temp 97.7 F (36.5 C) (Temporal)   Ht 5' 1.75" (1.568 m)   Wt 134 lb 9.6 oz (61.1 kg)   SpO2 98%   BMI 24.82 kg/m  Wt Readings from Last 3 Encounters:  02/10/20 134 lb 9.6 oz (61.1 kg)  01/04/20 132 lb 2 oz (59.9 kg)  10/25/19 130 lb (59 kg)    Physical Exam Vitals and nursing note reviewed.  Constitutional:      Appearance: He is well-developed.  HENT:     Head: Normocephalic and atraumatic.  Cardiovascular:     Rate and Rhythm: Normal rate and regular rhythm.     Heart sounds: Normal heart sounds. No murmur heard.  No friction rub. No gallop.   Pulmonary:     Effort: Pulmonary effort is normal. No tachypnea or respiratory distress.     Breath sounds: Normal breath sounds. No decreased breath sounds, wheezing, rhonchi or rales.  Chest:     Chest wall: No tenderness.  Abdominal:      General: Bowel sounds are normal.     Palpations: Abdomen is soft.  Musculoskeletal:        General: Normal range of motion.     Cervical back: Normal range of motion.  Skin:    General: Skin is warm and dry.  Neurological:     Mental Status: He is alert and oriented to person, place, and time.     Coordination: Coordination normal.  Psychiatric:        Behavior: Behavior normal. Behavior is cooperative.  Thought Content: Thought content normal.        Judgment: Judgment normal.          Patient has been counseled extensively about nutrition and exercise as well as the importance of adherence with medications and regular follow-up. The patient was given clear instructions to go to ER or return to medical center if symptoms don't improve, worsen or new problems develop. The patient verbalized understanding.   Follow-up: Return in about 3 months (around 05/12/2020).   Gildardo Pounds, FNP-BC [facility] [place], [place]   02/10/2020, 5:29 PM

## 2020-02-17 MED FILL — LISINOPRIL 10 MG TABS: 10 | 30 days supply | Qty: 30 | Fill #1

## 2020-02-29 MED FILL — PANTOPRAZOLE SOD DR 20 MG T: 20 | 30 days supply | Qty: 30 | Fill #1

## 2020-02-29 MED FILL — AMLODIPINE BESYLATE 10 MG T: 10 | 30 days supply | Qty: 30 | Fill #1

## 2020-02-29 MED FILL — LISINOPRIL 10 MG TABS: 10 | 30 days supply | Qty: 30 | Fill #1

## 2020-03-06 ENCOUNTER — Ambulatory Visit: Payer: Self-pay | Admitting: Physician Assistant

## 2020-04-25 ENCOUNTER — Other Ambulatory Visit (INDEPENDENT_AMBULATORY_CARE_PROVIDER_SITE_OTHER): Payer: Self-pay

## 2020-04-25 ENCOUNTER — Encounter: Payer: Self-pay | Admitting: Nurse Practitioner

## 2020-04-25 ENCOUNTER — Ambulatory Visit (INDEPENDENT_AMBULATORY_CARE_PROVIDER_SITE_OTHER): Payer: Self-pay | Admitting: Nurse Practitioner

## 2020-04-25 VITALS — BP 126/80 | HR 88 | Ht 61.75 in | Wt 133.0 lb

## 2020-04-25 DIAGNOSIS — R7989 Other specified abnormal findings of blood chemistry: Secondary | ICD-10-CM

## 2020-04-25 DIAGNOSIS — K219 Gastro-esophageal reflux disease without esophagitis: Secondary | ICD-10-CM

## 2020-04-25 LAB — HEPATIC FUNCTION PANEL
ALT: 26 U/L (ref 0–53)
AST: 30 U/L (ref 0–37)
Albumin: 4.9 g/dL (ref 3.5–5.2)
Alkaline Phosphatase: 103 U/L (ref 39–117)
Bilirubin, Direct: 0.1 mg/dL (ref 0.0–0.3)
Total Bilirubin: 0.7 mg/dL (ref 0.2–1.2)
Total Protein: 8.3 g/dL (ref 6.0–8.3)

## 2020-04-25 NOTE — Progress Notes (Signed)
04/25/2020 Stephen Oneal [id number] 1976-03-07   Chief Complaint:  Stomach pain  History of Present Illness: Stephen Oneal is a 44 year old male with a past medical history of anxiety, depression, hypertension, prediabetes, GERD and + Covid 19 07/2019.  He presents today accompanied by his wife and a [facility] interpreter as he speaks [place]. I initially saw the patient in office on 01/04/2020 for further evaluation regarding nausea and epigastric pain in the setting of alcohol abuse.  He was prescribed Omeprazole 20 mg daily which was switched back to Pantoprazole 64m QD by his PCP about 6 weeks ago.  No alcohol was recommended.  He was uninsured at that time therefore an abdominal ultrasound, EGD and colonoscopy were discussed but were not ordered until he obtained health insurance or [place] assistance/orange card.  Currently, he denies having any upper abdominal pain for the past month.  However, his wife is present and she stated he complains of stomach burning at least once weekly.  He continues to take Pantoprazole 20 mg daily.  No further nausea.  He continues to drink 2-3(22 oz) years daily.  He is passing a normal brown formed  bowel movement daily.  No rectal bleeding or melena. No other complaints today.   . Hepatic Function Latest Ref Rng & Units 01/05/2020 12/15/2019 12/10/2019  Total Protein 6.0 - 8.5 g/dL 7.2 7.3 7.7  Albumin 4.0 - 5.0 g/dL 4.7 4.7 4.4  AST 0 - 40 IU/L 28 84(H) 86(H)  ALT 0 - 44 IU/L 25 103(H) 65(H)  Alk Phosphatase 48 - 121 IU/L 124(H) 135(H) 105  Total Bilirubin 0.0 - 1.2 mg/dL 0.3 0.6 1.2  Bilirubin, Direct 0.00 - 0.40 mg/dL 0.11 - -    Current Medications, Allergies, Past Medical History, Past Surgical History, Family History and Social History were reviewed in CReliant Energyrecord.   Review of Systems:   Constitutional: Negative for fever, sweats, chills or weight loss.  Respiratory: Negative for  shortness of breath.   Cardiovascular: Negative for chest pain, palpitations and leg swelling.  Gastrointestinal: See HPI.  Musculoskeletal: Negative for back pain or muscle aches.  Neurological: Negative for dizziness, headaches or paresthesias.    Physical Exam: BP 126/80 (BP Location: Left Arm, Patient Position: Sitting, Cuff Size: Normal)   Pulse 88   Ht 5' 1.75" (1.568 m)   Wt 133 lb (60.3 kg)   BMI 24.52 kg/m  General: Well developed 89year male in no acute distress. Head: Normocephalic and atraumatic. Eyes: No scleral icterus. Conjunctiva pink . Ears: Normal auditory acuity. Lungs: Clear throughout to auscultation. Heart: Regular rate and rhythm, no murmur. Abdomen: Soft, nontender and nondistended. No masses or hepatomegaly. Normal bowel sounds x 4 quadrants.  Rectal: Deferred. Musculoskeletal: Symmetrical with no gross deformities. Extremities: No edema. Neurological: Alert oriented x 4. No focal deficits.  Psychological: Alert and cooperative. Normal mood and affect  Assessment and Recommendations:  118  44year old male with nausea and epigastric pain, resolved.  However, his wife is concerned he continues to have upper abdominal burning pain. -Continue Pantoprazole 20 mg daily -Discussed scheduling an EGD once he has health insurance or when his [facility] assistance orange card is activated  2.  Elevated LFTs, most likely due to alcohol abuse. -Repeat hepatic panel -Discussed scheduling an abdominal ultrasound and further liver tests once health insurance or [facility] assistance  orange card received  3.  Colon cancer screening -Discussed scheduling a screening colonoscopy once health insurance or  [facility] assistance orange card received  Patient to follow-up in the office in 3 months and as needed

## 2020-04-25 NOTE — Patient Instructions (Addendum)
If you are age 46 or older, your body mass index should be between 23-30. Your Body mass index is 24.52 kg/m. If this is out of the aforementioned range listed, please consider follow up with your Primary Care Provider.  If you are age 17 or younger, your body mass index should be between 19-25. Your Body mass index is 24.52 kg/m. If this is out of the aformentioned range listed, please consider follow up with your Primary Care Provider.    Your provider has requested that you go to the basement level for lab work before leaving today. Press "B" on the elevator. The lab is located at the first door on the left as you exit the elevator.  Please contact our office when you receive your orange card activated. We would like to do an abdominal Sonogram and an EGD/Colonoscopy if you are willing.  Follow up in 3 months. Please call the office in November to schedule a December 2021 appointment Continue your Pantoprazole.   Due to recent changes in healthcare laws, you may see the results of your imaging and laboratory studies on MyChart before your provider has had a chance to review them.  We understand that in some cases there may be results that are confusing or concerning to you. Not all laboratory results come back in the same time frame and the provider may be waiting for multiple results in order to interpret others.  Please give [place] 48 hours in order for your provider to thoroughly review all the results before contacting the office for clarification of your results.   Thank you for choosing [facility] Arnaldo Natal, CRNP  [phone]

## 2020-04-26 NOTE — Progress Notes (Signed)
Agree with the assessment and plan as outlined by Colleen Kennedy-Smith, NP.   Laquinta Hazell, DO, FACG [facility]   

## 2020-04-30 ENCOUNTER — Telehealth: Payer: Self-pay | Admitting: Nurse Practitioner

## 2020-04-30 NOTE — Telephone Encounter (Signed)
Called patient to provide him with recent lab results per [facility]. Spoke with the patients wife and advise her the results are normal she said she will notify her husband. No questions at this time.

## 2020-05-01 NOTE — Telephone Encounter (Signed)
-----   Message from Arnaldo Natal, NP sent at 04/25/2020  6:04 PM EDT ----- French Ana, pls have spanish speaking staff inform the patient his liver enzymes were normal.  THX

## 2020-05-02 MED FILL — PANTOPRAZOLE SOD DR 20 MG T: 20 | 30 days supply | Qty: 30 | Fill #2

## 2020-05-02 MED FILL — LISINOPRIL 10 MG TABS: 10 | 30 days supply | Qty: 30 | Fill #2

## 2020-05-02 MED FILL — AMLODIPINE BESYLATE 10 MG T: 10 | 30 days supply | Qty: 30 | Fill #2

## 2020-05-14 ENCOUNTER — Other Ambulatory Visit: Payer: Self-pay

## 2020-05-14 ENCOUNTER — Encounter: Payer: Self-pay | Admitting: Nurse Practitioner

## 2020-05-14 ENCOUNTER — Ambulatory Visit: Payer: Self-pay | Attending: Nurse Practitioner | Admitting: Nurse Practitioner

## 2020-05-14 VITALS — BP 126/82 | HR 86 | Temp 97.7°F | Ht 61.75 in | Wt 134.8 lb

## 2020-05-14 DIAGNOSIS — F32A Depression, unspecified: Secondary | ICD-10-CM

## 2020-05-14 DIAGNOSIS — R7303 Prediabetes: Secondary | ICD-10-CM

## 2020-05-14 DIAGNOSIS — I10 Essential (primary) hypertension: Secondary | ICD-10-CM

## 2020-05-14 DIAGNOSIS — F419 Anxiety disorder, unspecified: Secondary | ICD-10-CM

## 2020-05-14 LAB — GLUCOSE, POCT (MANUAL RESULT ENTRY): POC Glucose: 117 mg/dl — AB (ref 70–99)

## 2020-05-14 LAB — POCT GLYCOSYLATED HEMOGLOBIN (HGB A1C): Hemoglobin A1C: 5.7 % — AB (ref 4.0–5.6)

## 2020-05-14 NOTE — Progress Notes (Signed)
Assessment & Plan:  Stephen Oneal was seen today for follow-up.  Diagnoses and all orders for this visit:  Essential hypertension -     Basic metabolic panel Continue all antihypertensives as prescribed.  Remember to bring in your blood pressure log with you for your follow up appointment.  DASH/Mediterranean Diets are healthier choices for HTN.    Prediabetes -     Glucose (CBG) -     HgB A1c -     Basic metabolic panel Continue blood sugar control as discussed in office today, low carbohydrate diet, and regular physical exercise as tolerated, 150 minutes per week (30 min each day, 5 days per week, or 50 min 3 days per week).   Anxiety and depression Continue hydroxyzine 10 mg TID prn as prescribed.     Patient has been counseled on age-appropriate routine health concerns for screening and prevention. These are reviewed and up-to-date. Referrals have been placed accordingly. Immunizations are up-to-date or declined.    Subjective:   Chief Complaint  Patient presents with   Follow-up    Pt. is here for blood pressure check and prediabetes.    HPI Stephen Oneal 44 y.o. male presents to office today for follow up.  has a past medical history of Alcohol abuse, Anxiety, Depression, Elevated LFTs, GERD (gastroesophageal reflux disease), Hypertension, and Prediabetes.   Essential Hypertension Well controlled. Taking amlodipine 10 mg daily and lisinopril 62m daily. Denies chest pain, shortness of breath, palpitations, lightheadedness, dizziness, headaches or BLE edema.  BP Readings from Last 3 Encounters:  05/14/20 126/82  04/25/20 126/80  02/10/20 (!) 147/78    Prediabetes Well controlled with diet at this time.  Lab Results  Component Value Date   HGBA1C 5.7 (A) 05/14/2020   Anxiety  Well controlled with hydroxyzine 10 mg TID prn.   Review of Systems  Constitutional: Negative for fever, malaise/fatigue and weight loss.  HENT: Negative.  Negative for  nosebleeds.   Eyes: Negative.  Negative for blurred vision, double vision and photophobia.  Respiratory: Negative.  Negative for cough and shortness of breath.   Cardiovascular: Negative.  Negative for chest pain, palpitations and leg swelling.  Gastrointestinal: Negative.  Negative for heartburn, nausea and vomiting.  Musculoskeletal: Negative.  Negative for myalgias.  Neurological: Negative.  Negative for dizziness, focal weakness, seizures and headaches.  Psychiatric/Behavioral: Negative for suicidal ideas.    Past Medical History:  Diagnosis Date   Alcohol abuse    Anxiety    Depression    Elevated LFTs    GERD (gastroesophageal reflux disease)    Hypertension    Prediabetes     Past Surgical History:  Procedure Laterality Date   NO PAST SURGERIES      Family History  Problem Relation Age of Onset   Diabetes Neg Hx    Hypertension Neg Hx     Social History Reviewed with no changes to be made today.   Outpatient Medications Prior to Visit  Medication Sig Dispense Refill   blood glucose meter kit and supplies Dispense based on patient and insurance preference. Use up to four times daily as directed. (FOR ICD-10 E10.9, E11.9). 1 each 0   hydrOXYzine (ATARAX/VISTARIL) 10 MG tablet Take 1 tablet (10 mg total) by mouth 3 (three) times daily as needed. For ANXIETY (Patient taking differently: Take 10 mg by mouth 3 (three) times daily as needed for anxiety. ) 60 tablet 1   lisinopril (ZESTRIL) 10 MG tablet Take 1 tablet (10 mg total) by mouth  daily. 90 tablet 1   pantoprazole (PROTONIX) 20 MG tablet Take 20 mg by mouth daily.     amLODipine (NORVASC) 10 MG tablet Take 1 tablet (10 mg total) by mouth daily. 90 tablet 1   No facility-administered medications prior to visit.    Allergies  Allergen Reactions   Fish Allergy    Shrimp [Shellfish Allergy]        Objective:    BP 126/82 (BP Location: Left Arm, Patient Position: Sitting, Cuff Size: Normal)     Pulse 86    Temp 97.7 F (36.5 C) (Temporal)    Ht 5' 1.75" (1.568 m)    Wt 134 lb 12.8 oz (61.1 kg)    SpO2 97%    BMI 24.86 kg/m  Wt Readings from Last 3 Encounters:  05/14/20 134 lb 12.8 oz (61.1 kg)  04/25/20 133 lb (60.3 kg)  02/10/20 134 lb 9.6 oz (61.1 kg)    Physical Exam Vitals and nursing note reviewed.  Constitutional:      Appearance: He is well-developed.  HENT:     Head: Normocephalic and atraumatic.  Cardiovascular:     Rate and Rhythm: Normal rate and regular rhythm.     Heart sounds: Normal heart sounds. No murmur heard.  No friction rub. No gallop.   Pulmonary:     Effort: Pulmonary effort is normal. No tachypnea or respiratory distress.     Breath sounds: Normal breath sounds. No decreased breath sounds, wheezing, rhonchi or rales.  Chest:     Chest wall: No tenderness.  Abdominal:     General: Bowel sounds are normal.     Palpations: Abdomen is soft.  Musculoskeletal:        General: Normal range of motion.     Cervical back: Normal range of motion.  Skin:    General: Skin is warm and dry.  Neurological:     Mental Status: He is alert and oriented to person, place, and time.     Coordination: Coordination normal.  Psychiatric:        Behavior: Behavior normal. Behavior is cooperative.        Thought Content: Thought content normal.        Judgment: Judgment normal.          Patient has been counseled extensively about nutrition and exercise as well as the importance of adherence with medications and regular follow-up. The patient was given clear instructions to go to ER or return to medical center if symptoms don't improve, worsen or new problems develop. The patient verbalized understanding.   Follow-up: Return in about 3 months (around 08/14/2020).   Gildardo Pounds, FNP-BC [facility] and [place], [place]   05/14/2020, 4:34 PM

## 2020-05-15 LAB — BASIC METABOLIC PANEL
BUN/Creatinine Ratio: 18 (ref 9–20)
BUN: 10 mg/dL (ref 6–24)
CO2: 27 mmol/L (ref 20–29)
Calcium: 9.7 mg/dL (ref 8.7–10.2)
Chloride: 100 mmol/L (ref 96–106)
Creatinine, Ser: 0.56 mg/dL — ABNORMAL LOW (ref 0.76–1.27)
GFR calc Af Amer: 143 mL/min/{1.73_m2} (ref >59)
GFR calc non Af Amer: 124 mL/min/{1.73_m2} (ref >59)
Glucose: 97 mg/dL (ref 65–99)
Potassium: 3.8 mmol/L (ref 3.5–5.2)
Sodium: 142 mmol/L (ref 134–144)

## 2020-06-27 MED FILL — LISINOPRIL 10 MG TABS: 10 | 30 days supply | Qty: 30 | Fill #3

## 2020-06-27 MED FILL — PANTOPRAZOLE SOD DR 20 MG T: 20 | 30 days supply | Qty: 30 | Fill #3

## 2020-06-27 MED FILL — AMLODIPINE BESYLATE 10 MG T: 10 | 30 days supply | Qty: 30 | Fill #3

## 2020-08-15 ENCOUNTER — Other Ambulatory Visit: Payer: Self-pay

## 2020-08-15 ENCOUNTER — Ambulatory Visit: Payer: Self-pay | Admitting: Nurse Practitioner

## 2020-08-17 ENCOUNTER — Ambulatory Visit: Payer: Self-pay | Attending: Nurse Practitioner | Admitting: Internal Medicine

## 2020-08-17 ENCOUNTER — Encounter: Payer: Self-pay | Admitting: Internal Medicine

## 2020-08-17 ENCOUNTER — Other Ambulatory Visit: Payer: Self-pay | Admitting: Internal Medicine

## 2020-08-17 ENCOUNTER — Other Ambulatory Visit: Payer: Self-pay

## 2020-08-17 DIAGNOSIS — I10 Essential (primary) hypertension: Secondary | ICD-10-CM

## 2020-08-17 MED ORDER — AMLODIPINE BESYLATE 10 MG PO TABS: 10.0000 mg | ORAL_TABLET | Freq: Every day | ORAL | 1 refills | Status: DC

## 2020-08-17 MED ORDER — LISINOPRIL 20 MG PO TABS: 20.0000 mg | ORAL_TABLET | Freq: Every day | ORAL | 3 refills | Status: DC

## 2020-08-17 MED FILL — AMLODIPINE BESYLATE 10 MG T: 10 | 30 days supply | Qty: 30 | Fill #0

## 2020-08-17 MED FILL — LISINOPRIL 20 MG TABLET: 20 | 30 days supply | Qty: 30 | Fill #0

## 2020-08-17 NOTE — Assessment & Plan Note (Signed)
Not as well controlled Continue amolidpine Increase lisinopril to 20 mg po qd.

## 2020-08-17 NOTE — Progress Notes (Signed)
Blood pressure f/u  Interpreter present.  Patient feels well.  He comes from work (loading doc).  No home BPs  He has no complaints on current meds  Past Medical History:  Diagnosis Date  . Alcohol abuse   . Anxiety   . Depression   . Elevated LFTs   . GERD (gastroesophageal reflux disease)   . Hypertension   . Prediabetes     Social History   Socioeconomic History  . Marital status: Married    Spouse name: Not on file  . Number of children: 2  . Years of education: Not on file  . Highest education level: Not on file  Occupational History  . Occupation: Programmer, systems  Tobacco Use  . Smoking status: Former Smoker    Types: Cigarettes    Quit date: 01/04/2015    Years since quitting: 5.6  . Smokeless tobacco: Never Used  Vaping Use  . Vaping Use: Never used  Substance and Sexual Activity  . Alcohol use: Yes    Alcohol/week: 2.0 standard drinks    Types: 2 Cans of beer per week    Comment: daily  . Drug use: No  . Sexual activity: Yes  Other Topics Concern  . Not on file  Social History Narrative  . Not on file   Social Determinants of Health   Financial Resource Strain: Not on file  Food Insecurity: Not on file  Transportation Needs: Not on file  Physical Activity: Not on file  Stress: Not on file  Social Connections: Not on file  Intimate Partner Violence: Not on file    Past Surgical History:  Procedure Laterality Date  . NO PAST SURGERIES      Family History  Problem Relation Age of Onset  . Diabetes Neg Hx   . Hypertension Neg Hx     Allergies  Allergen Reactions  . Fish Allergy   . Shrimp [Shellfish Allergy]     Current Outpatient Medications on File Prior to Visit  Medication Sig Dispense Refill  . amLODipine (NORVASC) 10 MG tablet Take 1 tablet (10 mg total) by mouth daily. 90 tablet 1  . lisinopril (ZESTRIL) 10 MG tablet Take 1 tablet (10 mg total) by mouth daily. 90 tablet 1  . pantoprazole (PROTONIX) 20 MG tablet Take 20 mg by  mouth daily.    . blood glucose meter kit and supplies Dispense based on patient and insurance preference. Use up to four times daily as directed. (FOR ICD-10 E10.9, E11.9). 1 each 0  . hydrOXYzine (ATARAX/VISTARIL) 10 MG tablet Take 1 tablet (10 mg total) by mouth 3 (three) times daily as needed. For ANXIETY (Patient not taking: Reported on 08/17/2020) 60 tablet 1   No current facility-administered medications on file prior to visit.     patient denies chest pain, shortness of breath, orthopnea. Denies lower extremity edema, abdominal pain, change in appetite, change in bowel movements. Patient denies rashes, musculoskeletal complaints. No other specific complaints in a complete review of systems.   BP (!) 161/93   Pulse 82   Resp 16   Wt 133 lb (60.3 kg)   SpO2 98%   BMI 24.52 kg/m   well-developed well-nourished male in no acute distress. HEENT exam atraumatic, normocephalic, neck supple without jugular venous distention. Chest clear to auscultation cardiac exam S1-S2 are regular. Abdominal exam overweight with bowel sounds, soft and nontender. Extremities no edema. Neurologic exam is alert with a normal gait.  Essential hypertension Not as well controlled Continue  amolidpine Increase lisinopril to 20 mg po qd.

## 2020-11-03 ENCOUNTER — Other Ambulatory Visit: Payer: Self-pay

## 2020-11-21 ENCOUNTER — Encounter: Payer: Self-pay | Admitting: Nurse Practitioner

## 2020-11-21 ENCOUNTER — Ambulatory Visit: Payer: Self-pay | Attending: Nurse Practitioner | Admitting: Nurse Practitioner

## 2020-11-21 DIAGNOSIS — R7989 Other specified abnormal findings of blood chemistry: Secondary | ICD-10-CM

## 2020-11-21 DIAGNOSIS — R7401 Elevation of levels of liver transaminase levels: Secondary | ICD-10-CM

## 2020-11-21 DIAGNOSIS — E785 Hyperlipidemia, unspecified: Secondary | ICD-10-CM

## 2020-11-21 DIAGNOSIS — I10 Essential (primary) hypertension: Secondary | ICD-10-CM

## 2020-11-21 DIAGNOSIS — R7303 Prediabetes: Secondary | ICD-10-CM

## 2020-11-21 NOTE — Progress Notes (Signed)
Virtual Visit via Telephone Note Due to national recommendations of social distancing due to COVID 19, telehealth visit is felt to be most appropriate for this patient at this time.  I discussed the limitations, risks, security and privacy concerns of performing an evaluation and management service by telephone and the availability of in person appointments. I also discussed with the patient that there may be a patient responsible charge related to this service. The patient expressed understanding and agreed to proceed.    I connected with Stephen Oneal on 11/21/20  at   4:10 PM EDT  EDT by telephone and verified that I am speaking with the correct person using two identifiers.   Consent I discussed the limitations, risks, security and privacy concerns of performing an evaluation and management service by telephone and the availability of in person appointments. I also discussed with the patient that there may be a patient responsible charge related to this service. The patient expressed understanding and agreed to proceed.   Location of Patient: Private Residence   [facility] of Provider: [facility]    Persons participating in Telemedicine visit: Stephen Denver FNP-BC [facility] CMA Winner Santos-Martinez  Spanish Interpreter [place] [phone]   History of Present Illness: Telemedicine visit for: Follow up He has a past medical history of Alcohol abuse, Anxiety, Depression, Elevated LFTs, GERD, Hypertension, and Prediabetes.  Essential Hypertension Blood pressure is not well controlled. He states he has not been taking his antihypertensives since January as he feels fine. I instructed him that he should not stop any of his medications unless instructed by a provider. He is to resume his medications. Denies chest pain, shortness of breath, palpitations, lightheadedness, dizziness, headaches or BLE edema. He is to restart taking amlodipine 10 mg daily and  lisinopril 20 mg daily. He will return in a few weeks for BP check.  BP Readings from Last 3 Encounters:  08/17/20 (!) 161/93  05/14/20 126/82  04/25/20 126/80    He has not taken pantoprazole since October 2021. Denies any symptoms of GERD or heartburn.   Past Medical History:  Diagnosis Date  . Alcohol abuse   . Anxiety   . Depression   . Elevated LFTs   . GERD (gastroesophageal reflux disease)   . Hypertension   . Prediabetes     Past Surgical History:  Procedure Laterality Date  . NO PAST SURGERIES      Family History  Problem Relation Age of Onset  . Diabetes Neg Hx   . Hypertension Neg Hx     Social History   Socioeconomic History  . Marital status: Married    Spouse name: Not on file  . Number of children: 2  . Years of education: Not on file  . Highest education level: Not on file  Occupational History  . Occupation: Lobbyist  Tobacco Use  . Smoking status: Former Smoker    Types: Cigarettes    Quit date: 01/04/2015    Years since quitting: 5.8  . Smokeless tobacco: Never Used  Vaping Use  . Vaping Use: Never used  Substance and Sexual Activity  . Alcohol use: Yes    Alcohol/week: 2.0 standard drinks    Types: 2 Cans of beer per week    Comment: daily  . Drug use: No  . Sexual activity: Yes  Other Topics Concern  . Not on file  Social History Narrative  . Not on file   Social Determinants of Health  Financial Resource Strain: Not on file  Food Insecurity: Not on file  Transportation Needs: Not on file  Physical Activity: Not on file  Stress: Not on file  Social Connections: Not on file     Observations/Objective: Awake, alert and oriented x 3   Review of Systems  Constitutional: Negative for fever, malaise/fatigue and weight loss.  HENT: Negative.  Negative for nosebleeds.   Eyes: Negative.  Negative for blurred vision, double vision and photophobia.  Respiratory: Negative.  Negative for cough and shortness of breath.    Cardiovascular: Negative.  Negative for chest pain, palpitations and leg swelling.  Gastrointestinal: Negative.  Negative for heartburn, nausea and vomiting.  Musculoskeletal: Negative.  Negative for myalgias.  Neurological: Negative.  Negative for dizziness, focal weakness, seizures and headaches.  Psychiatric/Behavioral: Negative.  Negative for suicidal ideas.    Assessment and Plan: Ronon was seen today for diabetes and hypertension.  Diagnoses and all orders for this visit:  Essential hypertension Continue all antihypertensives as prescribed.  Remember to bring in your blood pressure log with you for your follow up appointment.  DASH/Mediterranean Diets are healthier choices for HTN.     Follow Up Instructions Return in about 2 weeks (around 12/05/2020) for BP CHECK WITH LUKE.See me in 3 months    I discussed the assessment and treatment plan with the patient. The patient was provided an opportunity to ask questions and all were answered. The patient agreed with the plan and demonstrated an understanding of the instructions.   The patient was advised to call back or seek an in-person evaluation if the symptoms worsen or if the condition fails to improve as anticipated.  I provided 15 minutes of non-face-to-face time during this encounter including median intraservice time, reviewing previous notes, labs, imaging, medications and explaining diagnosis and management.  Claiborne Rigg, FNP-BC

## 2020-12-14 ENCOUNTER — Ambulatory Visit: Payer: Self-pay | Attending: Nurse Practitioner | Admitting: Pharmacist

## 2020-12-14 ENCOUNTER — Other Ambulatory Visit: Payer: Self-pay

## 2020-12-14 ENCOUNTER — Encounter: Payer: Self-pay | Admitting: Pharmacist

## 2020-12-14 VITALS — BP 123/73

## 2020-12-14 DIAGNOSIS — I10 Essential (primary) hypertension: Secondary | ICD-10-CM

## 2020-12-14 NOTE — Progress Notes (Signed)
   S:    PCP: Zelda   Patient arrives in good spirits. Presents to the clinic for hypertension management. Patient was referred by Zelda on 11/21/2020. At that visit, she restarted pt's blood pressure medication as he stopped these prior to that visit.   Patient reports adherence with medications.  Denies chest pain, dyspnea, HA or blurred vision. Denies BLE edema.   Current BP Medications include:  Amlodipine 10 mg daily, lisinopril 10 mg daily  Dietary habits include: does not limit salt, denies drinking caffeine  Exercise habits include: does not exercise outside of work Family / Social history:  - FHx: pertinent negatives include DM, HTN - Never smoker - Drinks 1-2 beers/day  Home BP readings: not checking  O:  Vitals:   12/14/20 1627  BP: 123/73   Last 3 Office BP readings: BP Readings from Last 3 Encounters:  12/14/20 123/73  08/17/20 (!) 161/93  05/14/20 126/82   BMET    Component Value Date/Time   NA 142 05/14/2020 1642   K 3.8 05/14/2020 1642   CL 100 05/14/2020 1642   CO2 27 05/14/2020 1642   GLUCOSE 97 05/14/2020 1642   GLUCOSE 153 (H) 12/10/2019 1021   BUN 10 05/14/2020 1642   CREATININE 0.56 (L) 05/14/2020 1642   CALCIUM 9.7 05/14/2020 1642   GFRNONAA 124 05/14/2020 1642   GFRAA 143 05/14/2020 1642   Renal function: CrCl cannot be calculated (Patient's most recent lab result is older than the maximum 21 days allowed.).  Clinical ASCVD: No  The 10-year ASCVD risk score Denman George DC Jr., et al., 2013) is: 1.5%   Values used to calculate the score:     Age: 28 years     Sex: Male     Is Non-Hispanic African American: No     Diabetic: No     Tobacco smoker: No     Systolic Blood Pressure: 123 mmHg     Is BP treated: Yes     HDL Cholesterol: 59 mg/dL     Total Cholesterol: 156 mg/dL  A/P: Hypertension longstanding currently at goal on current medications. His BP Goal <130/80 mmHg. Patient is adherent with current medications. Will continue current  regimen for now.  -Continued current regimen.  -Counseled on lifestyle modifications for blood pressure control including reduced dietary sodium, increased exercise, adequate sleep  Results reviewed and written information provided. Total time in face-to-face counseling 15 minutes.   F/U Clinic Visit with me in 1 month.     Butch Penny, PharmD, Patsy [place], CPP Clinical Pharmacist [facility] & [facility] [phone]

## 2020-12-21 ENCOUNTER — Ambulatory Visit: Payer: Self-pay | Attending: Nurse Practitioner

## 2020-12-21 ENCOUNTER — Encounter: Payer: Self-pay | Admitting: Nurse Practitioner

## 2020-12-21 ENCOUNTER — Other Ambulatory Visit: Payer: Self-pay

## 2020-12-21 DIAGNOSIS — R7303 Prediabetes: Secondary | ICD-10-CM

## 2020-12-21 DIAGNOSIS — R7401 Elevation of levels of liver transaminase levels: Secondary | ICD-10-CM

## 2020-12-21 DIAGNOSIS — R7989 Other specified abnormal findings of blood chemistry: Secondary | ICD-10-CM

## 2020-12-21 DIAGNOSIS — E785 Hyperlipidemia, unspecified: Secondary | ICD-10-CM

## 2020-12-21 DIAGNOSIS — I10 Essential (primary) hypertension: Secondary | ICD-10-CM

## 2020-12-22 LAB — CMP14+EGFR
ALT: 47 IU/L — ABNORMAL HIGH (ref 0–44)
AST: 59 IU/L — ABNORMAL HIGH (ref 0–40)
Albumin/Globulin Ratio: 1.8 (ref 1.2–2.2)
Albumin: 4.9 g/dL (ref 4.0–5.0)
Alkaline Phosphatase: 111 IU/L (ref 44–121)
BUN/Creatinine Ratio: 8 — ABNORMAL LOW (ref 9–20)
BUN: 6 mg/dL (ref 6–24)
Bilirubin Total: 0.3 mg/dL (ref 0.0–1.2)
CO2: 22 mmol/L (ref 20–29)
Calcium: 9.2 mg/dL (ref 8.7–10.2)
Chloride: 101 mmol/L (ref 96–106)
Creatinine, Ser: 0.71 mg/dL — ABNORMAL LOW (ref 0.76–1.27)
Globulin, Total: 2.8 g/dL (ref 1.5–4.5)
Glucose: 98 mg/dL (ref 65–99)
Potassium: 4.1 mmol/L (ref 3.5–5.2)
Sodium: 138 mmol/L (ref 134–144)
Total Protein: 7.7 g/dL (ref 6.0–8.5)
eGFR: 114 mL/min/{1.73_m2} (ref >59)

## 2020-12-22 LAB — LIPID PANEL
Chol/HDL Ratio: 4.1 ratio (ref 0.0–5.0)
Cholesterol, Total: 211 mg/dL — ABNORMAL HIGH (ref 100–199)
HDL: 51 mg/dL (ref >39)
LDL Chol Calc (NIH): 108 mg/dL — ABNORMAL HIGH (ref 0–99)
Triglycerides: 304 mg/dL — ABNORMAL HIGH (ref 0–149)
VLDL Cholesterol Cal: 52 mg/dL — ABNORMAL HIGH (ref 5–40)

## 2020-12-22 LAB — HEMOGLOBIN A1C
Est. average glucose Bld gHb Est-mCnc: 126 mg/dL
Hgb A1c MFr Bld: 6 % — ABNORMAL HIGH (ref 4.8–5.6)

## 2020-12-22 LAB — CBC
Hematocrit: 44.6 % (ref 37.5–51.0)
Hemoglobin: 15.1 g/dL (ref 13.0–17.7)
MCH: 31.6 pg (ref 26.6–33.0)
MCHC: 33.9 g/dL (ref 31.5–35.7)
MCV: 93 fL (ref 79–97)
Platelets: 249 10*3/uL (ref 150–450)
RBC: 4.78 x10E6/uL (ref 4.14–5.80)
RDW: 12.4 % (ref 11.6–15.4)
WBC: 5.8 10*3/uL (ref 3.4–10.8)

## 2021-01-03 ENCOUNTER — Other Ambulatory Visit: Payer: Self-pay

## 2021-01-03 MED FILL — Amlodipine Besylate Tab 10 MG (Base Equivalent): ORAL | 30 days supply | Qty: 30 | Fill #0 | Status: AC

## 2021-01-03 MED FILL — Lisinopril Tab 20 MG: ORAL | 30 days supply | Qty: 30 | Fill #0 | Status: AC

## 2021-01-25 ENCOUNTER — Other Ambulatory Visit: Payer: Self-pay

## 2021-01-25 ENCOUNTER — Encounter: Payer: Self-pay | Admitting: Pharmacist

## 2021-01-25 ENCOUNTER — Ambulatory Visit: Payer: Self-pay | Attending: Nurse Practitioner | Admitting: Pharmacist

## 2021-01-25 VITALS — BP 118/70

## 2021-01-25 DIAGNOSIS — I10 Essential (primary) hypertension: Secondary | ICD-10-CM

## 2021-01-25 NOTE — Progress Notes (Signed)
   S:    PCP: Zelda   Patient arrives in good spirits. Presents to the clinic for hypertension management. Patient was referred by Zelda on 11/21/2020. I saw him on 12/14/2020 and his BP was good.   Patient reports adherence with medications.  Denies chest pain, dyspnea, HA or blurred vision. Denies BLE edema.   Current BP Medications include:  Amlodipine 10 mg daily, lisinopril 10 mg daily  Dietary habits include: does not limit salt, denies drinking caffeine  Exercise habits include: does not exercise outside of work Family / Social history:  - FHx: pertinent negatives include DM, HTN - Never smoker - Drinks 1-2 beers/day  Home BP readings: not checking  O:  Vitals:   01/25/21 1616  BP: 118/70    Last 3 Office BP readings: BP Readings from Last 3 Encounters:  01/25/21 118/70  12/14/20 123/73  08/17/20 (!) 161/93   BMET    Component Value Date/Time   NA 138 12/21/2020 0900   K 4.1 12/21/2020 0900   CL 101 12/21/2020 0900   CO2 22 12/21/2020 0900   GLUCOSE 98 12/21/2020 0900   GLUCOSE 153 (H) 12/10/2019 1021   BUN 6 12/21/2020 0900   CREATININE 0.71 (L) 12/21/2020 0900   CALCIUM 9.2 12/21/2020 0900   GFRNONAA 124 05/14/2020 1642   GFRAA 143 05/14/2020 1642   Renal function: CrCl cannot be calculated (Patient's most recent lab result is older than the maximum 21 days allowed.).  Clinical ASCVD: No  The 10-year ASCVD risk score Denman George DC Jr., et al., 2013) is: 2.7%   Values used to calculate the score:     Age: 45 years     Sex: Male     Is Non-Hispanic African American: No     Diabetic: No     Tobacco smoker: No     Systolic Blood Pressure: 118 mmHg     Is BP treated: Yes     HDL Cholesterol: 51 mg/dL     Total Cholesterol: 211 mg/dL  A/P: Hypertension longstanding currently at goal on current medications. His BP Goal <130/80 mmHg. Patient is adherent with current medications. Will continue current regimen for now.  -Continued current regimen.   -Counseled on lifestyle modifications for blood pressure control including reduced dietary sodium, increased exercise, adequate sleep  Results reviewed and written information provided. Total time in face-to-face counseling 15 minutes.   F/U Clinic Visit with PCP.     Butch Penny, PharmD, Patsy [place], CPP Clinical Pharmacist [facility] & [facility] [phone]

## 2021-03-06 ENCOUNTER — Other Ambulatory Visit: Payer: Self-pay

## 2021-03-06 MED FILL — Lisinopril Tab 20 MG: ORAL | 30 days supply | Qty: 30 | Fill #1 | Status: AC

## 2021-03-06 MED FILL — Amlodipine Besylate Tab 10 MG (Base Equivalent): ORAL | 30 days supply | Qty: 30 | Fill #1 | Status: AC

## 2021-05-21 ENCOUNTER — Other Ambulatory Visit: Payer: Self-pay

## 2021-05-21 MED FILL — Lisinopril Tab 20 MG: ORAL | 30 days supply | Qty: 30 | Fill #2 | Status: AC

## 2021-05-21 MED FILL — Amlodipine Besylate Tab 10 MG (Base Equivalent): ORAL | 30 days supply | Qty: 30 | Fill #2 | Status: AC

## 2021-05-22 ENCOUNTER — Other Ambulatory Visit: Payer: Self-pay

## 2021-05-23 ENCOUNTER — Other Ambulatory Visit: Payer: Self-pay

## 2021-08-21 ENCOUNTER — Other Ambulatory Visit: Payer: Self-pay

## 2021-08-21 ENCOUNTER — Other Ambulatory Visit: Payer: Self-pay | Admitting: Nurse Practitioner

## 2021-08-21 DIAGNOSIS — I10 Essential (primary) hypertension: Secondary | ICD-10-CM

## 2021-08-21 NOTE — Telephone Encounter (Signed)
Requested medication (s) are due for refill today: yes  Requested medication (s) are on the active medication list: yes    Last refill: Lisinopril 08/17/20  #90  3 refills     Norvasc  08/17/20  #90 1 refill  Future visit scheduled no  Notes to clinic:Historical provider  Requested Prescriptions  Pending Prescriptions Disp Refills   lisinopril (ZESTRIL) 20 MG tablet 90 tablet 3    Sig: TAKE 1 TABLET (20 MG TOTAL) BY MOUTH DAILY.     Cardiovascular:  ACE Inhibitors Failed - 08/21/2021  4:07 PM      Failed - Cr in normal range and within 180 days    Creatinine, Ser  Date Value Ref Range Status  12/21/2020 0.71 (L) 0.76 - 1.27 mg/dL Final          Failed - K in normal range and within 180 days    Potassium  Date Value Ref Range Status  12/21/2020 4.1 3.5 - 5.2 mmol/L Final          Failed - Valid encounter within last 6 months    Recent Outpatient Visits           6 months ago Essential hypertension   [facility] Drucilla Chalet, RPH-CPP   8 months ago Essential hypertension   [facility] Drucilla Chalet, RPH-CPP   9 months ago Essential hypertension   [facility] [place], Shea Stakes, NP   1 year ago Essential hypertension   [facility] [place], Valetta Mole, MD   1 year ago Essential hypertension   [facility] [place], Shea Stakes, NP              Passed - Patient is not pregnant      Passed - Last BP in normal range    BP Readings from Last 1 Encounters:  01/25/21 118/70           amLODipine (NORVASC) 10 MG tablet 90 tablet 1    Sig: TAKE 1 TABLET (10 MG TOTAL) BY MOUTH DAILY.     Cardiovascular:  Calcium Channel Blockers Failed - 08/21/2021  4:07 PM      Failed - Valid encounter within last 6 months    Recent Outpatient Visits           6 months ago Essential hypertension   [facility] [place], Cornelius Moras, RPH-CPP   8 months ago Essential hypertension   [facility] Lois Huxley, Cornelius Moras, RPH-CPP   9 months ago Essential hypertension   [facility] [place], Shea Stakes, NP   1 year ago Essential hypertension   [facility] [place], Valetta Mole, MD   1 year ago Essential hypertension    [facility] [place], [place] W, NP              Passed - Last BP in normal range    BP Readings from Last 1 Encounters:  01/25/21 118/70

## 2021-08-22 ENCOUNTER — Other Ambulatory Visit: Payer: Self-pay

## 2021-08-22 MED ORDER — LISINOPRIL 20 MG PO TABS
ORAL_TABLET | Freq: Every day | ORAL | 0 refills | Status: DC
  Filled 2021-08-22: qty 30, 30d supply, fill #0

## 2021-08-22 MED ORDER — AMLODIPINE BESYLATE 10 MG PO TABS
ORAL_TABLET | Freq: Every day | ORAL | 0 refills | Status: DC
  Filled 2021-08-22: qty 30, 30d supply, fill #0

## 2021-08-26 ENCOUNTER — Other Ambulatory Visit: Payer: Self-pay

## 2021-08-28 ENCOUNTER — Other Ambulatory Visit: Payer: Self-pay

## 2021-12-17 ENCOUNTER — Other Ambulatory Visit: Payer: Self-pay | Admitting: Family Medicine

## 2021-12-17 ENCOUNTER — Other Ambulatory Visit: Payer: Self-pay

## 2021-12-17 DIAGNOSIS — I10 Essential (primary) hypertension: Secondary | ICD-10-CM

## 2021-12-18 ENCOUNTER — Other Ambulatory Visit: Payer: Self-pay

## 2022-01-01 ENCOUNTER — Ambulatory Visit: Payer: Self-pay | Admitting: *Deleted

## 2022-01-01 NOTE — Telephone Encounter (Signed)
  Chief Complaint: wife called: needs medication RF, vomiting, possible depression. Call to patient- he is at work- patient states he ate bad food yesterday and vomited. Patient states he took Pepto and he is better today- no vomiting- normal diet. Patient ask for RF- strongly advised him to keep appointment tomorrow- unable to refill without appointment- verified time and location. Was not able to discuss possible depression- patient had to get back to work- but note will be forwarded to office for review of concerns. Symptoms: vomiting- better now Frequency: yesterday Pertinent Negatives: Patient denies abdominal pain, vomiting now Disposition: [] ED /[] Urgent Care (no appt availability in office) / [x] Appointment(In office/virtual)/ []  [place] [facility]/ [] Home Care/ [] Refused Recommended Disposition /[] [place] [facility]/ []  Follow-up with PCP Additional Notes: Interpreter: [phone]

## 2022-01-01 NOTE — Telephone Encounter (Signed)
Summary: med refill and vomiting   Pts wife called to get an appt for med refill / pt has been without meds and vomiting / she also mentioned that yesterday was their sons birthday that took his life and she thinks the pt needs medication to help with how he feels/ pt was not with her / he was at work but she said the nurse can call / please advise      Reason for Disposition  MILD vomiting with diarrhea  Answer Assessment - Initial Assessment Questions 1. VOMITING SEVERITY: "How many times have you vomited in the past 24 hours?"     - MILD:  1 - 2 times/day    - MODERATE: 3 - 5 times/day, decreased oral intake without significant weight loss or symptoms of dehydration    - SEVERE: 6 or more times/day, vomits everything or nearly everything, with significant weight loss, symptoms of dehydration      Yesterday- patient ate bad food 2. ONSET: "When did the vomiting begin?"      yesterday 3. FLUIDS: "What fluids or food have you vomited up today?" "Have you been able to keep any fluids down?"     Normal diet 4. ABDOMINAL PAIN: "Are your having any abdominal pain?" If yes : "How bad is it and what does it feel like?" (e.g., crampy, dull, intermittent, constant)      no 5. DIARRHEA: "Is there any diarrhea?" If Yes, ask: "How many times today?"      Yes- took Pepto  6. CONTACTS: "Is there anyone else in the family with the same symptoms?"      no 7. CAUSE: "What do you think is causing your vomiting?"     Bad food 8. HYDRATION STATUS: "Any signs of dehydration?" (e.g., dry mouth [not only dry lips], too weak to stand) "When did you last urinate?"     no 9. OTHER SYMPTOMS: "Do you have any other symptoms?" (e.g., fever, headache, vertigo, vomiting blood or coffee grounds, recent head injury)     no 10. PREGNANCY: "Is there any chance you are pregnant?" "When was your last menstrual period?"       *No Answer*  Protocols used: Vomiting-A-AH

## 2022-01-02 ENCOUNTER — Ambulatory Visit (INDEPENDENT_AMBULATORY_CARE_PROVIDER_SITE_OTHER): Payer: Self-pay | Admitting: Physician Assistant

## 2022-01-02 ENCOUNTER — Other Ambulatory Visit: Payer: Self-pay

## 2022-01-02 ENCOUNTER — Ambulatory Visit: Payer: Self-pay | Admitting: Physician Assistant

## 2022-01-02 ENCOUNTER — Encounter: Payer: Self-pay | Admitting: Physician Assistant

## 2022-01-02 VITALS — BP 169/91 | HR 74 | Temp 98.6°F | Resp 18 | Ht 64.0 in | Wt 133.0 lb

## 2022-01-02 DIAGNOSIS — F32A Depression, unspecified: Secondary | ICD-10-CM

## 2022-01-02 DIAGNOSIS — Z634 Disappearance and death of family member: Secondary | ICD-10-CM

## 2022-01-02 DIAGNOSIS — I10 Essential (primary) hypertension: Secondary | ICD-10-CM

## 2022-01-02 DIAGNOSIS — F419 Anxiety disorder, unspecified: Secondary | ICD-10-CM

## 2022-01-02 DIAGNOSIS — F4321 Adjustment disorder with depressed mood: Secondary | ICD-10-CM

## 2022-01-02 MED ORDER — HYDROXYZINE HCL 10 MG PO TABS
10.0000 mg | ORAL_TABLET | Freq: Three times a day (TID) | ORAL | 1 refills | Status: AC | PRN
  Filled 2022-01-02: qty 90, 30d supply, fill #0

## 2022-01-02 MED ORDER — FLUOXETINE HCL 20 MG PO TABS
20.0000 mg | ORAL_TABLET | Freq: Every day | ORAL | 1 refills | Status: AC
  Filled 2022-01-02: qty 30, 30d supply, fill #0

## 2022-01-02 MED ORDER — AMLODIPINE BESYLATE 10 MG PO TABS
ORAL_TABLET | Freq: Every day | ORAL | 1 refills | Status: DC
  Filled 2022-01-02: qty 30, 30d supply, fill #0
  Filled 2022-03-31: qty 30, 30d supply, fill #1

## 2022-01-02 MED ORDER — LISINOPRIL 20 MG PO TABS
ORAL_TABLET | Freq: Every day | ORAL | 1 refills | Status: AC
  Filled 2022-01-02: qty 30, 30d supply, fill #0
  Filled 2022-03-31: qty 30, 30d supply, fill #1

## 2022-01-02 NOTE — Progress Notes (Signed)
Established Patient Office Visit  Subjective   Patient ID: Stephen Oneal, male    DOB: May 30, 1976  Age: 46 y.o. MRN: [id number]  Chief Complaint  Patient presents with   Medication Refill    DEPRESSION    States that he has been having increased anxiety and depression due to the upcoming birthday of deceased son.  States that he was previously taking prozac and hydroxyzine with some relief.  States that he believes he ate some "bad cheese" last night and had a few episodes of vomiting and diarrhea earlier this morning.  States that he had some Pepto and sparkling water with relief, no further episodes.  Has been eating and drinking fine since then.  States that he ran out of his blood pressure medication one month ago.  Does not check his blood pressure at home.  Denies hypertensive symptoms.      01/02/2022    3:29 PM 02/10/2020    4:23 PM 12/07/2019    3:51 PM 10/25/2019    3:03 PM 09/29/2019    4:27 PM  Depression screen PHQ 2/9  Decreased Interest 0 0 1 0 0  Down, Depressed, Hopeless 0 0 1 0 0  PHQ - 2 Score 0 0 2 0 0  Altered sleeping  0 0 0 0  Tired, decreased energy  0 1 0 0  Change in appetite  0 1 2 0  Feeling bad or failure about yourself   0 0 0 0  Trouble concentrating  0 1 0 0  Moving slowly or fidgety/restless  0 0 0 0  Suicidal thoughts  0 0 0 0  PHQ-9 Score  0 5 2 0      01/02/2022    3:29 PM 02/10/2020    4:23 PM 12/07/2019    3:52 PM 10/25/2019    3:03 PM  GAD 7 : Generalized Anxiety Score  Nervous, Anxious, on Edge 0 0 1 0  Control/stop worrying 0 0 1 2  Worry too much - different things 0 0 1 2  Trouble relaxing 0 0 1 0  Restless 0 0 1 0  Easily annoyed or irritable 0 0 0 2  Afraid - awful might happen 0 0 1 0  Total GAD 7 Score 0 0 6 6       Due to language barrier, an interpreter was present during the history-taking and subsequent discussion (and for part of the physical exam) with this patient.   Past Medical History:  Diagnosis Date    Alcohol abuse    Anxiety    Depression    Elevated LFTs    GERD (gastroesophageal reflux disease)    Hypertension    Prediabetes    Social History   Socioeconomic History   Marital status: Married    Spouse name: Not on file   Number of children: 2   Years of education: Not on file   Highest education level: Not on file  Occupational History   Occupation: fork lift driver  Tobacco Use   Smoking status: Former    Types: Cigarettes    Quit date: 01/04/2015    Years since quitting: 7.0   Smokeless tobacco: Never  Vaping Use   Vaping Use: Never used  Substance and Sexual Activity   Alcohol use: Yes    Alcohol/week: 2.0 standard drinks    Types: 2 Cans of beer per week    Comment: daily   Drug use: No   Sexual activity: Yes  Other Topics Concern   Not on file  Social History Narrative   Not on file   Social Determinants of Health   Financial Resource Strain: Not on file  Food Insecurity: Not on file  Transportation Needs: Not on file  Physical Activity: Not on file  Stress: Not on file  Social Connections: Not on file  Intimate Partner Violence: Not on file   Family History  Problem Relation Age of Onset   Diabetes Neg Hx    Hypertension Neg Hx    Allergies  Allergen Reactions   Fish Allergy    Shrimp [Shellfish Allergy]       Review of Systems  Constitutional: Negative.   HENT: Negative.    Eyes: Negative.   Respiratory:  Negative for shortness of breath.   Cardiovascular:  Negative for chest pain.  Gastrointestinal: Negative.   Genitourinary: Negative.   Musculoskeletal: Negative.   Skin: Negative.   Neurological: Negative.   Endo/Heme/Allergies: Negative.   Psychiatric/Behavioral:  Positive for depression. The patient is nervous/anxious.      Objective:     BP (!) 169/91 (BP Location: Right Arm, Patient Position: Sitting, Cuff Size: Normal)   Pulse 74   Temp 98.6 F (37 C) (Oral)   Resp 18   Ht 5\' 4"  (1.626 m)   Wt 133 lb (60.3 kg)    SpO2 100%   BMI 22.83 kg/m  BP Readings from Last 3 Encounters:  01/02/22 (!) 169/91  01/25/21 118/70  12/14/20 123/73      Physical Exam Vitals and nursing note reviewed.  Constitutional:      Appearance: Normal appearance.  HENT:     Head: Normocephalic and atraumatic.     Right Ear: External ear normal.     Left Ear: External ear normal.     Nose: Nose normal.     Mouth/Throat:     Mouth: Mucous membranes are moist.     Pharynx: Oropharynx is clear.  Eyes:     Extraocular Movements: Extraocular movements intact.     Conjunctiva/sclera: Conjunctivae normal.     Pupils: Pupils are equal, round, and reactive to light.  Cardiovascular:     Rate and Rhythm: Normal rate and regular rhythm.     Pulses: Normal pulses.     Heart sounds: Normal heart sounds.  Pulmonary:     Effort: Pulmonary effort is normal.     Breath sounds: Normal breath sounds.  Musculoskeletal:        General: Normal range of motion.     Cervical back: Normal range of motion and neck supple.  Skin:    General: Skin is warm and dry.  Neurological:     General: No focal deficit present.     Mental Status: He is alert and oriented to person, place, and time.  Psychiatric:        Mood and Affect: Mood normal.        Behavior: Behavior normal.        Thought Content: Thought content normal.        Judgment: Judgment normal.       Assessment & Plan:   Problem List Items Addressed This Visit       Cardiovascular and Mediastinum   Essential hypertension   Relevant Medications   amLODipine (NORVASC) 10 MG tablet   lisinopril (ZESTRIL) 20 MG tablet   Other Visit Diagnoses     Anxiety and depression       Relevant Medications   hydrOXYzine (ATARAX)  10 MG tablet   FLUoxetine (PROZAC) 20 MG tablet     1. Essential hypertension Restart regimen.  Patient encouraged to check blood pressure at home on a daily basis, keep a written log and have available for all office visits.  Patient to return  to mobile unit in 2 weeks, patient to have fasting labs completed at that office visit. - amLODipine (NORVASC) 10 MG tablet; TAKE 1 TABLET (10 MG TOTAL) BY MOUTH DAILY.  Dispense: 30 tablet; Refill: 1 - lisinopril (ZESTRIL) 20 MG tablet; TAKE 1 TABLET (20 MG TOTAL) BY MOUTH DAILY.  Dispense: 30 tablet; Refill: 1  2. Anxiety and depression Patient agreeable to restart hydroxyzine and Prozac.  Patient education given on coping skills. - hydrOXYzine (ATARAX) 10 MG tablet; Take 1 tablet (10 mg total) by mouth 3 (three) times daily as needed. For ANXIETY  Dispense: 90 tablet; Refill: 1 - FLUoxetine (PROZAC) 20 MG tablet; Take 1 tablet (20 mg total) by mouth daily.  Dispense: 30 tablet; Refill: 1  3. Grief at loss of child    I have reviewed the patient's medical history (PMH, PSH, Social History, Family History, Medications, and allergies) , and have been updated if relevant. I spent 30 minutes reviewing chart and  face to face time with patient.     Return in about 2 weeks (around 01/16/2022) for with [facility].    Loraine Grip Mayers, PA-C

## 2022-01-02 NOTE — Progress Notes (Signed)
Patient has eaten today. Patient has not taken medication today. Patient denies pain at this time. Patient reports diarrhea and vomiting yesterday. Patients deceased sons birthday was this week and patient is needing depression medication restarted to cope.

## 2022-01-02 NOTE — Patient Instructions (Signed)
I have sent a refill of your blood pressure medications to the pharmacy.  I do encourage you to check your blood pressure at home, keep a written log and have available for all office visits.  You are going to restart taking Prozac once daily and you can use the hydroxyzine every 8 hours.  You will follow-up with the mobile unit in 2 weeks, please plan on having your fasting labs completed at that visit.  Roney Jaffe, PA-C Physician Assistant [facility] [email or web address]   Cmo sobrellevar una prdida en los adultos Managing Loss, Adult Las personas vivencian las prdidas de [place] a lo largo de sus vidas. Acontecimientos como una [place], un cambio de [place] y la prdida de amigos puede provocar una sensacin de prdida. La prdida puede ser tan grave como por ejemplo un cambio importante en la salud, un divorcio, la muerte de una mascota o la muerte de un ser querido. Es muy probable que todos estos tipos de prdida causen una reaccin fsica y emocional conocida como duelo. El duelo es el resultado de un cambio grande o la ausencia de algo o alguien que era muy importante para usted. El duelo es una reaccin normal ante la prdida. Diferentes factores pueden afectar su experiencia de duelo, por ejemplo: La naturaleza de la prdida. La relacin que tena con la persona que perdi o con lo que perdi. Su comprensin del duelo y cmo sobrellevarlo. Su red de contencin. Tenga en cuenta que cuando el duelo se torna intenso, puede ocasionar problemas ms graves [place], depresin, ansiedad o pensamientos suicidas. Hable con el mdico si tiene algunos de [employer]. Cmo manejar los cambios en el estilo de vida Mantenga su rutina habitual tanto como sea posible. Si tiene dificultad para concentrarse o [employer] [address], est bien que se tome un tiempo fuera de [place] rutina. Pase tiempo con  amigos y seres queridos. Siga una dieta saludable, duerma mucho y descanse cuando se sienta cansado. Cmo reconocer los cambios  La manera en que maneje el duelo afectar su capacidad de funcionar como lo hace normalmente. Cuando [employer], puede experimentar estos cambios: Health and safety inspector, choque, tristeza, ansiedad, enojo, negacin y [place]. Pensamientos sobre la [place]. Llanto repentino. Una sensacin fsica de vaco en el estmago. Problemas para dormir y Arts administrator. Cansancio (fatiga). Prdida de inters en actividades normales. Soar o Engineer, mining a la persona que muri. Una necesidad de recordar a la persona que perdi o lo que perdi. Dificultad para pensar en otra cosa que no sea la prdida durante un tiempo. Alivio. Si ha estado esperando la prdida durante un [place], es probable que sienta alivio cuando suceda. Siga estas instrucciones en su casa: Actividad Exprese sus sentimientos de [employer], como por ejemplo: Hable con otras personas sobre su prdida. Puede ser beneficioso encontrar otras personas que hayan tenido [place] prdida similar, como un grupo de apoyo. Anote sus sentimientos en un diario. Realice actividad fsica para liberar el estrs y la energa emocional. Maricela Curet actividades creativas como pintura, escultura o tocar un instrumento o Optometrist. Practique la resiliencia. Es la capacidad de reponerse y adaptarse despus de superar un obstculo. La lectura de algunas fuentes que incentiven a la resiliencia puede ser de ayuda para aprender las maneras de Museum/gallery conservator.  Instrucciones generales Sea paciente con usted mismo y con los dems. Deje que el proceso de duelo transcurra y recuerde que [place]. Algunos tipos de Probation officer que sienta que no  se termina nunca. Puede encontrar [place]ag[place] de seguir adelante sin olvidar ni dejar de tener sentimientos de aprecio por aquello que perdi. Aceptar la prdida es un [place]. Adaptarse  puede llevar meses o ms tiempo. Concurra a todas las visitas de seguimiento. Esto es importante. Dnde encontrar apoyo Para obtener apoyo para sobrellevar la prdida: Pida al mdico ayuda y recomendaciones, como asesoramiento o terapia para el duelo. Piense en la posibilidad de unirse a un grupo de apoyo para personas que estn sobrellevando una prdida. Dnde buscar ms informacin Puede encontrar ms informacin sobre cmo sobrellevar una prdida en: Arboriculturist (Sociedad Estadounidense de Oncologa Clnica): www.cancer.net American Psychological Association (Asociacin Estadounidense de Psicologa): DiceTournament.ca Comunquese con un mdico si: El duelo es intenso y [place]. El dolor es constante y no mejora. Es duelo comienza a Network engineer su organismo, por ejemplo, se enferma. Se siente deprimido, ansioso o desesperanzado. Solicite ayuda de inmediato si: Tiene pensamientos acerca de Runner, broadcasting/film/video o Physicist, medical a Economist. Busque ayuda de inmediato si alguna vez siente que puede hacerse dao a usted mismo o a otros, o tiene pensamientos de Patent examiner a su vida. Dirjase al centro de urgencias ms cercano o: Llame al 911. Llame a National Suicide Prevention Lifeline (Lnea Telefnica Nacional para la Prevencin del Suicidio) al 4406441997 o al 988. Est disponible las 24 horas del da. Enve un mensaje de texto a la lnea para casos de crisis al [phone]. Resumen El duelo es el resultado de un cambio grande o la ausencia de algo o alguien que era muy importante para usted. El duelo es una reaccin normal ante la prdida. Lo profundo del duelo y el perodo de recuperacin dependen del tipo de prdida as como de la capacidad para adaptarse al cambio y Software engineer los sentimientos. Procesar el duelo requiere ser paciente y estar dispuesto a aceptar sus sentimientos, y Careers adviser la prdida con personas que brindan contencin. Es Forensic scientist los recursos  adecuados para usted y Medical illustrator que cada persona vive el duelo de [place]. No existe un mismo proceso de duelo que suceda de la misma [place] para todas [employer]. Tenga en cuenta que cuando el duelo se torna intenso, puede ocasionar problemas ms graves [place], depresin, ansiedad o pensamientos suicidas. Hable con el mdico si tiene algunos de [employer]. Esta informacin no tiene Theme park manager el consejo del mdico. Asegrese de hacerle al mdico cualquier pregunta que tenga. Document Revised: 04/04/2021 Document Reviewed: 04/04/2021 Elsevier Patient Education  2023 [employer].

## 2022-03-28 ENCOUNTER — Other Ambulatory Visit: Payer: Self-pay

## 2022-03-31 ENCOUNTER — Other Ambulatory Visit: Payer: Self-pay

## 2022-04-01 ENCOUNTER — Other Ambulatory Visit: Payer: Self-pay

## 2022-04-17 IMAGING — DX DG CHEST 1V PORT
1 series · 1 of 1 positions shown · non-contrast
Comparison: 06/15/2019

CLINICAL DATA: Triage notes; Pt reports abd pain and vomiting x8
days, denies fevers or diarrhea. Reports he got his second covid
shot yesterday. Also endorses some shaking in both hands.

EXAM:
PORTABLE CHEST - 1 VIEW

[chest ap]
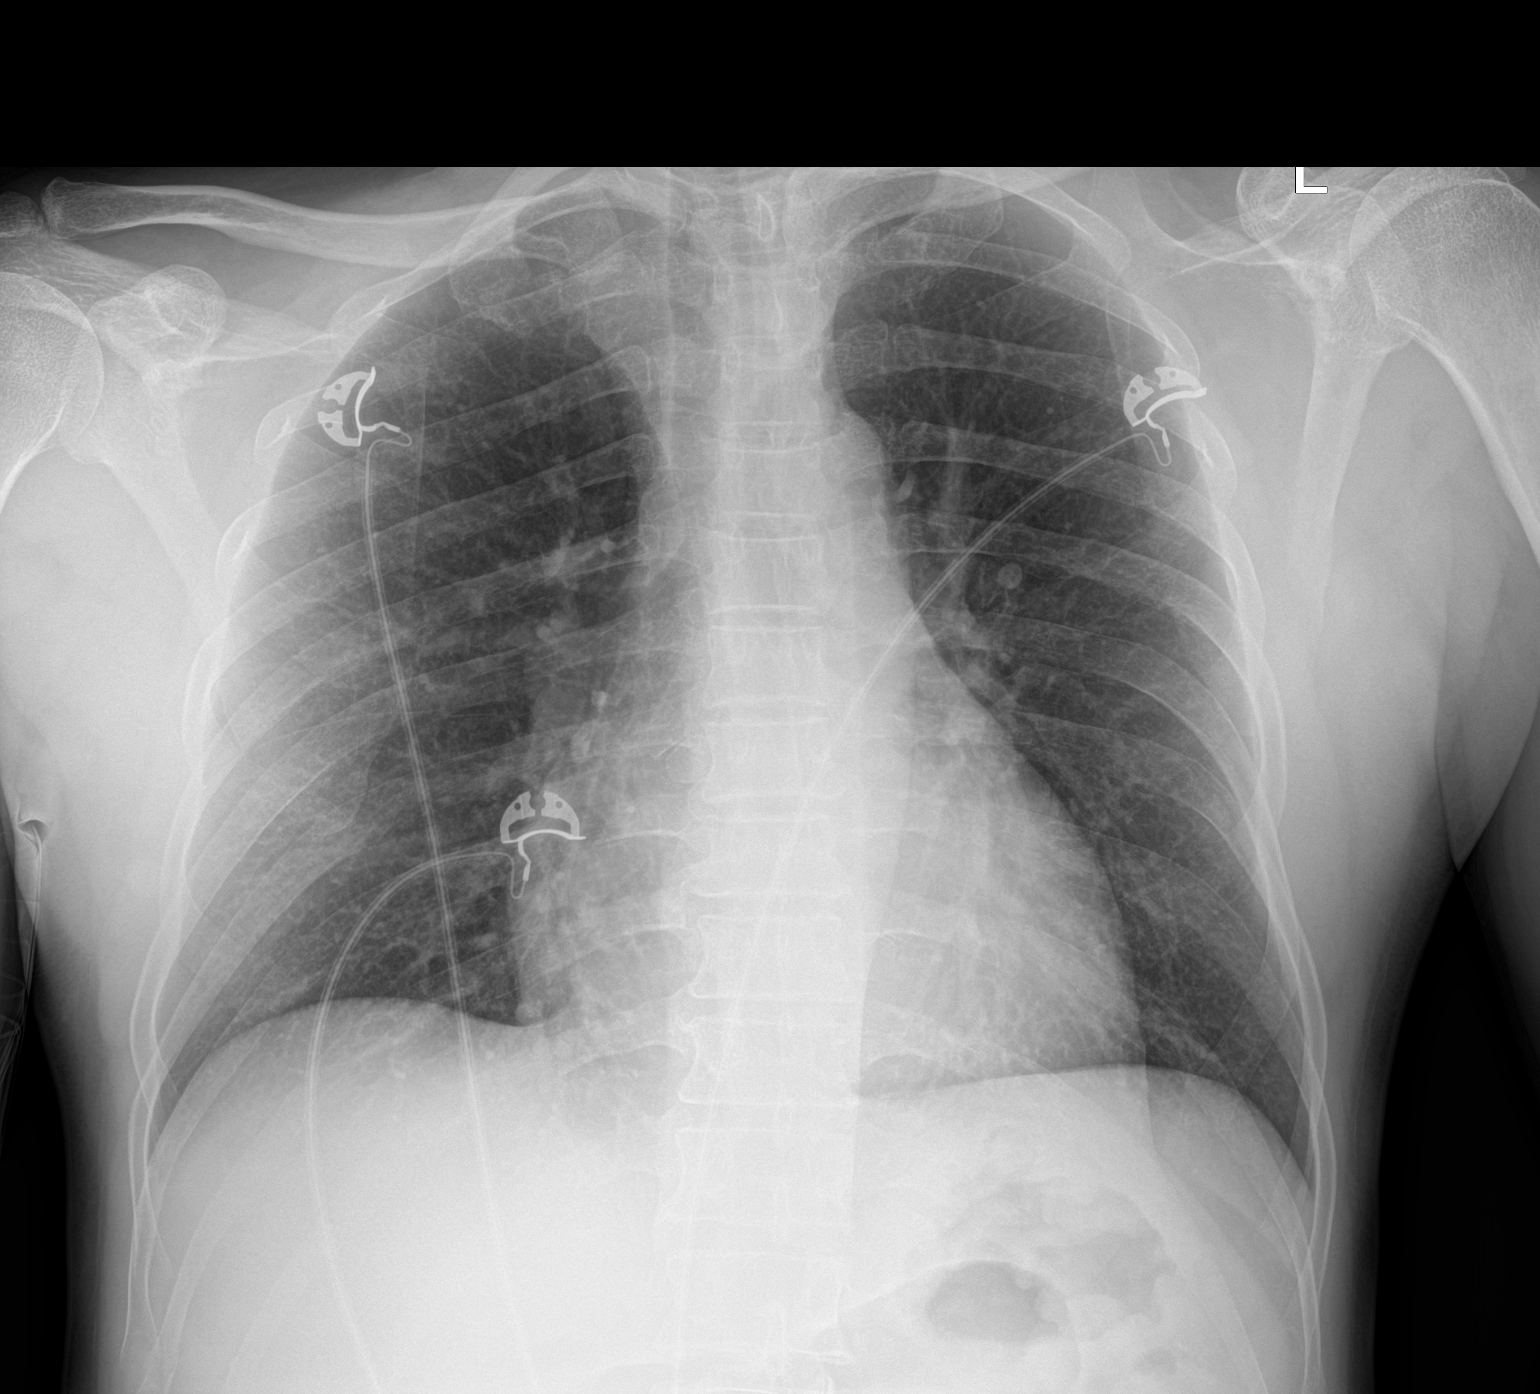

[1 of 1 positions shown; findings below may reference images not displayed]

FINDINGS: Coarse interstitial markings, stable.  No new infiltrate or edema.

Heart size and mediastinal contours are within normal limits.

No effusion.

Visualized bones unremarkable.
IMPRESSION: No acute cardiopulmonary disease.

## 2022-06-12 ENCOUNTER — Other Ambulatory Visit: Payer: Self-pay

## 2022-06-12 ENCOUNTER — Other Ambulatory Visit: Payer: Self-pay | Admitting: Nurse Practitioner

## 2022-06-12 DIAGNOSIS — I10 Essential (primary) hypertension: Secondary | ICD-10-CM

## 2022-06-12 MED ORDER — AMLODIPINE BESYLATE 10 MG PO TABS
10.0000 mg | ORAL_TABLET | Freq: Every day | ORAL | 1 refills | Status: DC
  Filled 2022-06-12: qty 30, 30d supply, fill #0
  Filled 2022-06-30: qty 90, 90d supply, fill #0
  Filled 2022-10-02: qty 90, 90d supply, fill #1

## 2022-06-12 NOTE — Telephone Encounter (Signed)
Requested Prescriptions  Pending Prescriptions Disp Refills   amLODipine (NORVASC) 10 MG tablet 90 tablet 1    Sig: TAKE 1 TABLET (10 MG TOTAL) BY MOUTH DAILY.     Cardiovascular: Calcium Channel Blockers 2 Failed - 06/12/2022  4:59 PM      Failed - Last BP in normal range    BP Readings from Last 1 Encounters:  01/02/22 (!) 169/91         Passed - Last Heart Rate in normal range    Pulse Readings from Last 1 Encounters:  01/02/22 74         Passed - Valid encounter within last 6 months    Recent Outpatient Visits           5 months ago Essential hypertension   [facility], Kasandra Knudsen, PA-C   1 year ago Essential hypertension   [facility], Cornelius Moras, RPH-CPP   1 year ago Essential hypertension   [place] [facility] [place] [place], Cornelius Moras, RPH-CPP   1 year ago Essential hypertension   [place] [facility] [place], Shea Stakes, NP   1 year ago Essential hypertension   [place] [facility] [place], Valetta Mole, MD               lisinopril (ZESTRIL) 20 MG tablet 30 tablet 1    Sig: TAKE 1 TABLET (20 MG TOTAL) BY MOUTH DAILY.     Cardiovascular:  ACE Inhibitors Failed - 06/12/2022  4:59 PM      Failed - Cr in normal range and within 180 days    Creatinine, Ser  Date Value Ref Range Status  12/21/2020 0.71 (L) 0.76 - 1.27 mg/dL Final         Failed - K in normal range and within 180 days    Potassium  Date Value Ref Range Status  12/21/2020 4.1 3.5 - 5.2 mmol/L Final         Failed - Last BP in normal range    BP Readings from Last 1 Encounters:  01/02/22 (!) 169/91         Passed - Patient is not pregnant      Passed - Valid encounter within last 6 months    Recent Outpatient Visits           5 months ago Essential hypertension   [facility], Kasandra Knudsen, PA-C   1 year ago Essential hypertension   [place]  [facility], Cornelius Moras, RPH-CPP   1 year ago Essential hypertension   [place] [facility] Lois Huxley, Cornelius Moras, RPH-CPP   1 year ago Essential hypertension   [place] [facility] [place], Shea Stakes, NP   1 year ago Essential hypertension   [place] [facility] [place], Valetta Mole, MD

## 2022-06-12 NOTE — Telephone Encounter (Signed)
Requested medications are due for refill today.  yes  Requested medications are on the active medications list.  yes  Last refill. 01/02/2022 #30 1 rf  Future visit scheduled.   no  Notes to clinic.  Labs are expired.    Requested Prescriptions  Pending Prescriptions Disp Refills   lisinopril (ZESTRIL) 20 MG tablet 30 tablet 1    Sig: TAKE 1 TABLET (20 MG TOTAL) BY MOUTH DAILY.     Cardiovascular:  ACE Inhibitors Failed - 06/12/2022  4:59 PM      Failed - Cr in normal range and within 180 days    Creatinine, Ser  Date Value Ref Range Status  12/21/2020 0.71 (L) 0.76 - 1.27 mg/dL Final         Failed - K in normal range and within 180 days    Potassium  Date Value Ref Range Status  12/21/2020 4.1 3.5 - 5.2 mmol/L Final         Failed - Last BP in normal range    BP Readings from Last 1 Encounters:  01/02/22 (!) 169/91         Passed - Patient is not pregnant      Passed - Valid encounter within last 6 months    Recent Outpatient Visits           5 months ago Essential hypertension   [facility] at [facility] [place], Kasandra Knudsen, PA-C   1 year ago Essential hypertension   [place] [facility] Drucilla Chalet, RPH-CPP   1 year ago Essential hypertension   [place] [facility] Lo[facility] H[facility], Cornelius Moras, RPH-CPP   1 year ago Essential hypertension   [place] [facility] [place], Shea Stakes, NP   1 year ago Essential hypertension   [place] [facility] [place], Valetta Mole, MD              Signed Prescriptions Disp Refills   amLODipine (NORVASC) 10 MG tablet 90 tablet 1    Sig: TAKE 1 TABLET (10 MG TOTAL) BY MOUTH DAILY.     Cardiovascular: Calcium Channel Blockers 2 Failed - 06/12/2022  4:59 PM      Failed - Last BP in normal range    BP Readings from Last 1 Encounters:  01/02/22 (!) 169/91         Passed - Last Heart Rate in normal range    Pulse Readings from Last 1  Encounters:  01/02/22 74         Passed - Valid encounter within last 6 months    Recent Outpatient Visits           5 months ago Essential hypertension   [facility] [place], Kasandra Knudsen, PA-C   1 year ago Essential hypertension   [facility], Cornelius Moras, RPH-CPP   1 year ago Essential hypertension   [place] [facility] H[facility], Cornelius Moras, RPH-CPP   1 year ago Essential hypertension   [place] [facility] [place], Shea Stakes, NP   1 year ago Essential hypertension    [facility] [place], Valetta Mole, MD

## 2022-06-13 ENCOUNTER — Other Ambulatory Visit: Payer: Self-pay

## 2022-06-16 ENCOUNTER — Other Ambulatory Visit: Payer: Self-pay

## 2022-06-18 ENCOUNTER — Other Ambulatory Visit: Payer: Self-pay | Admitting: Nurse Practitioner

## 2022-06-18 ENCOUNTER — Ambulatory Visit: Payer: Self-pay | Attending: Nurse Practitioner

## 2022-06-18 DIAGNOSIS — R7989 Other specified abnormal findings of blood chemistry: Secondary | ICD-10-CM

## 2022-06-18 DIAGNOSIS — E785 Hyperlipidemia, unspecified: Secondary | ICD-10-CM

## 2022-06-18 DIAGNOSIS — Z1211 Encounter for screening for malignant neoplasm of colon: Secondary | ICD-10-CM

## 2022-06-18 DIAGNOSIS — R7303 Prediabetes: Secondary | ICD-10-CM

## 2022-06-18 DIAGNOSIS — I10 Essential (primary) hypertension: Secondary | ICD-10-CM

## 2022-06-19 ENCOUNTER — Other Ambulatory Visit: Payer: Self-pay

## 2022-06-19 LAB — CMP14+EGFR
ALT: 35 IU/L (ref 0–44)
AST: 51 IU/L — ABNORMAL HIGH (ref 0–40)
Albumin/Globulin Ratio: 1.8 (ref 1.2–2.2)
Albumin: 5.1 g/dL (ref 4.1–5.1)
Alkaline Phosphatase: 118 IU/L (ref 44–121)
BUN/Creatinine Ratio: 14 (ref 9–20)
BUN: 11 mg/dL (ref 6–24)
Bilirubin Total: 0.7 mg/dL (ref 0.0–1.2)
CO2: 23 mmol/L (ref 20–29)
Calcium: 9.9 mg/dL (ref 8.7–10.2)
Chloride: 97 mmol/L (ref 96–106)
Creatinine, Ser: 0.77 mg/dL (ref 0.76–1.27)
Globulin, Total: 2.9 g/dL (ref 1.5–4.5)
Glucose: 90 mg/dL (ref 70–99)
Potassium: 4 mmol/L (ref 3.5–5.2)
Sodium: 138 mmol/L (ref 134–144)
Total Protein: 8 g/dL (ref 6.0–8.5)
eGFR: 110 mL/min/{1.73_m2} (ref >59)

## 2022-06-19 LAB — LIPID PANEL
Chol/HDL Ratio: 3.9 ratio (ref 0.0–5.0)
Cholesterol, Total: 244 mg/dL — ABNORMAL HIGH (ref 100–199)
HDL: 63 mg/dL (ref >39)
LDL Chol Calc (NIH): 153 mg/dL — ABNORMAL HIGH (ref 0–99)
Triglycerides: 155 mg/dL — ABNORMAL HIGH (ref 0–149)
VLDL Cholesterol Cal: 28 mg/dL (ref 5–40)

## 2022-06-19 LAB — HEMOGLOBIN A1C
Est. average glucose Bld gHb Est-mCnc: 123 mg/dL
Hgb A1c MFr Bld: 5.9 % — ABNORMAL HIGH (ref 4.8–5.6)

## 2022-06-20 ENCOUNTER — Other Ambulatory Visit: Payer: Self-pay | Admitting: Nurse Practitioner

## 2022-06-20 DIAGNOSIS — E7800 Pure hypercholesterolemia, unspecified: Secondary | ICD-10-CM

## 2022-06-20 MED ORDER — ATORVASTATIN CALCIUM 20 MG PO TABS
20.0000 mg | ORAL_TABLET | Freq: Every day | ORAL | 3 refills | Status: AC
  Filled 2022-06-20 – 2022-06-30 (×2): qty 90, 90d supply, fill #0
  Filled 2022-10-02: qty 90, 90d supply, fill #1
  Filled 2023-03-18: qty 90, 90d supply, fill #2

## 2022-06-23 ENCOUNTER — Other Ambulatory Visit: Payer: Self-pay

## 2022-06-27 ENCOUNTER — Other Ambulatory Visit: Payer: Self-pay

## 2022-06-30 ENCOUNTER — Other Ambulatory Visit: Payer: Self-pay

## 2022-07-01 ENCOUNTER — Other Ambulatory Visit: Payer: Self-pay

## 2022-09-10 ENCOUNTER — Other Ambulatory Visit: Payer: Self-pay

## 2022-09-16 ENCOUNTER — Other Ambulatory Visit (HOSPITAL_COMMUNITY): Payer: Self-pay

## 2022-10-02 ENCOUNTER — Other Ambulatory Visit: Payer: Self-pay

## 2023-02-20 ENCOUNTER — Other Ambulatory Visit: Payer: Self-pay

## 2023-02-20 ENCOUNTER — Other Ambulatory Visit: Payer: Self-pay | Admitting: Nurse Practitioner

## 2023-02-20 DIAGNOSIS — I10 Essential (primary) hypertension: Secondary | ICD-10-CM

## 2023-02-23 ENCOUNTER — Other Ambulatory Visit: Payer: Self-pay

## 2023-02-23 MED ORDER — AMLODIPINE BESYLATE 10 MG PO TABS
10.0000 mg | ORAL_TABLET | Freq: Every day | ORAL | 0 refills | Status: AC
  Filled 2023-02-23 – 2023-03-18 (×2): qty 30, 30d supply, fill #0

## 2023-02-23 NOTE — Telephone Encounter (Signed)
Call to patient- appointment scheduled by wife- Interpreter: [phone] Courtesy 30 day Rx given

## 2023-03-02 ENCOUNTER — Other Ambulatory Visit: Payer: Self-pay

## 2023-03-18 ENCOUNTER — Other Ambulatory Visit: Payer: Self-pay

## 2023-04-10 ENCOUNTER — Encounter: Payer: Self-pay | Admitting: Nurse Practitioner

## 2023-05-02 ENCOUNTER — Other Ambulatory Visit: Payer: Self-pay

## 2023-05-02 ENCOUNTER — Emergency Department (HOSPITAL_COMMUNITY)
Admission: EM | Admit: 2023-05-02 | Discharge: 2023-05-03 | Disposition: A | Discharge status: Home / Self Care | Payer: Self-pay | Attending: Emergency Medicine | Admitting: Emergency Medicine

## 2023-05-02 ENCOUNTER — Encounter (HOSPITAL_COMMUNITY): Payer: Self-pay

## 2023-05-02 DIAGNOSIS — M545 Low back pain, unspecified: Secondary | ICD-10-CM

## 2023-05-02 DIAGNOSIS — I2510 Atherosclerotic heart disease of native coronary artery without angina pectoris: Secondary | ICD-10-CM | POA: Insufficient documentation

## 2023-05-02 DIAGNOSIS — I10 Essential (primary) hypertension: Secondary | ICD-10-CM | POA: Insufficient documentation

## 2023-05-02 HISTORY — DX: Other specified health status: Z78.9

## 2023-05-02 HISTORY — DX: Atherosclerotic heart disease of native coronary artery without angina pectoris: I25.10

## 2023-05-02 LAB — CBC WITH DIFFERENTIAL/PLATELET
Abs Immature Granulocytes: 0.01 10*3/uL (ref 0.00–0.07)
Basophils Absolute: 0.1 10*3/uL (ref 0.0–0.1)
Basophils Relative: 1 %
Eosinophils Absolute: 0.1 10*3/uL (ref 0.0–0.5)
Eosinophils Relative: 1 %
HCT: 43.2 % (ref 39.0–52.0)
Hemoglobin: 15 g/dL (ref 13.0–17.0)
Immature Granulocytes: 0 %
Lymphocytes Relative: 36 %
Lymphs Abs: 2.5 10*3/uL (ref 0.7–4.0)
MCH: 31.4 pg (ref 26.0–34.0)
MCHC: 34.7 g/dL (ref 30.0–36.0)
MCV: 90.6 fL (ref 80.0–100.0)
Monocytes Absolute: 0.6 10*3/uL (ref 0.1–1.0)
Monocytes Relative: 9 %
Neutro Abs: 3.6 10*3/uL (ref 1.7–7.7)
Neutrophils Relative %: 53 %
Platelets: 220 10*3/uL (ref 150–400)
RBC: 4.77 MIL/uL (ref 4.22–5.81)
RDW: 12.2 % (ref 11.5–15.5)
WBC: 6.9 10*3/uL (ref 4.0–10.5)
nRBC: 0 % (ref 0.0–0.2)

## 2023-05-02 MED ORDER — FENTANYL CITRATE PF 50 MCG/ML IJ SOSY
50.0000 ug | PREFILLED_SYRINGE | Freq: Once | INTRAMUSCULAR | Status: AC
  Administered 2023-05-02: 50 ug via INTRAVENOUS
  Filled 2023-05-02: qty 1
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
## 2023-05-03 ENCOUNTER — Emergency Department (HOSPITAL_COMMUNITY): Payer: Self-pay

## 2023-05-03 LAB — I-STAT CHEM 8, ED
BUN: 11 mg/dL (ref 6–20)
Calcium, Ion: 1.06 mmol/L — ABNORMAL LOW (ref 1.15–1.40)
Chloride: 106 mmol/L (ref 98–111)
Creatinine, Ser: 1.1 mg/dL (ref 0.61–1.24)
Glucose, Bld: 108 mg/dL — ABNORMAL HIGH (ref 70–99)
HCT: 45 % (ref 39.0–52.0)
Hemoglobin: 15.3 g/dL (ref 13.0–17.0)
Potassium: 3.6 mmol/L (ref 3.5–5.1)
Sodium: 144 mmol/L (ref 135–145)
TCO2: 24 mmol/L (ref 22–32)

## 2023-05-03 MED ORDER — NAPROXEN 500 MG PO TABS: 500.0000 mg | ORAL_TABLET | Freq: Two times a day (BID) | ORAL | 0 refills | Status: AC

## 2023-05-03 MED ORDER — IOHEXOL 350 MG/ML SOLN
75.0000 mL | Freq: Once | INTRAVENOUS | Status: AC | PRN
  Administered 2023-05-03: 75 mL via INTRAVENOUS

## 2023-05-03 MED ORDER — OXYCODONE-ACETAMINOPHEN 5-325 MG PO TABS
1.0000 | ORAL_TABLET | Freq: Once | ORAL | Status: AC
  Administered 2023-05-03: 1 via ORAL
  Filled 2023-05-03: qty 1

## 2023-05-03 MED ORDER — METHOCARBAMOL 500 MG PO TABS: 500.0000 mg | ORAL_TABLET | Freq: Two times a day (BID) | ORAL | 0 refills | Status: AC

## 2023-05-03 NOTE — Discharge Instructions (Signed)
Take the prescribed medication as directed. °Follow-up with your primary care doctor. °Return to the ED for new or worsening symptoms. °

## 2023-05-04 ENCOUNTER — Encounter: Payer: Self-pay | Admitting: Physician Assistant
